# Patient Record
Sex: Female | Born: 1955 | Race: White | Hispanic: No | Marital: Married | Smoking: Never smoker
Health system: Southern US, Community
[De-identification: ages and names within clinical notes are randomized; demographics above are authoritative.]

## PROBLEM LIST (undated history)

## (undated) DIAGNOSIS — E7800 Pure hypercholesterolemia, unspecified: Secondary | ICD-10-CM

## (undated) DIAGNOSIS — Z800 Family history of malignant neoplasm of digestive organs: Secondary | ICD-10-CM

## (undated) DIAGNOSIS — K449 Diaphragmatic hernia without obstruction or gangrene: Secondary | ICD-10-CM

## (undated) DIAGNOSIS — C4491 Basal cell carcinoma of skin, unspecified: Secondary | ICD-10-CM

## (undated) DIAGNOSIS — Z9889 Other specified postprocedural states: Secondary | ICD-10-CM

## (undated) DIAGNOSIS — T7840XA Allergy, unspecified, initial encounter: Secondary | ICD-10-CM

## (undated) DIAGNOSIS — C44602 Unspecified malignant neoplasm of skin of right upper limb, including shoulder: Secondary | ICD-10-CM

## (undated) DIAGNOSIS — Z808 Family history of malignant neoplasm of other organs or systems: Secondary | ICD-10-CM

## (undated) DIAGNOSIS — G63 Polyneuropathy in diseases classified elsewhere: Secondary | ICD-10-CM

## (undated) DIAGNOSIS — F329 Major depressive disorder, single episode, unspecified: Secondary | ICD-10-CM

## (undated) DIAGNOSIS — R011 Cardiac murmur, unspecified: Secondary | ICD-10-CM

## (undated) DIAGNOSIS — Z803 Family history of malignant neoplasm of breast: Secondary | ICD-10-CM

## (undated) DIAGNOSIS — R252 Cramp and spasm: Principal | ICD-10-CM

## (undated) DIAGNOSIS — R269 Unspecified abnormalities of gait and mobility: Secondary | ICD-10-CM

## (undated) DIAGNOSIS — K219 Gastro-esophageal reflux disease without esophagitis: Secondary | ICD-10-CM

## (undated) DIAGNOSIS — F32A Depression, unspecified: Secondary | ICD-10-CM

## (undated) DIAGNOSIS — E109 Type 1 diabetes mellitus without complications: Secondary | ICD-10-CM

## (undated) DIAGNOSIS — M1990 Unspecified osteoarthritis, unspecified site: Secondary | ICD-10-CM

## (undated) DIAGNOSIS — H269 Unspecified cataract: Secondary | ICD-10-CM

## (undated) HISTORY — DX: Type 1 diabetes mellitus without complications: E10.9

## (undated) HISTORY — DX: Polyneuropathy in diseases classified elsewhere: G63

## (undated) HISTORY — DX: Family history of malignant neoplasm of breast: Z80.3

## (undated) HISTORY — DX: Pure hypercholesterolemia, unspecified: E78.00

## (undated) HISTORY — DX: Allergy, unspecified, initial encounter: T78.40XA

## (undated) HISTORY — PX: KNEE ARTHROSCOPY: SUR90

## (undated) HISTORY — DX: Family history of malignant neoplasm of digestive organs: Z80.0

## (undated) HISTORY — DX: Cramp and spasm: R25.2

## (undated) HISTORY — DX: Unspecified osteoarthritis, unspecified site: M19.90

## (undated) HISTORY — PX: CHOLECYSTECTOMY: SHX55

## (undated) HISTORY — PX: MANDIBLE SURGERY: SHX707

## (undated) HISTORY — DX: Unspecified malignant neoplasm of skin of right upper limb, including shoulder: C44.602

## (undated) HISTORY — DX: Basal cell carcinoma of skin, unspecified: C44.91

## (undated) HISTORY — DX: Gastro-esophageal reflux disease without esophagitis: K21.9

## (undated) HISTORY — PX: TONSILLECTOMY: SUR1361

## (undated) HISTORY — PX: OTHER SURGICAL HISTORY: SHX169

## (undated) HISTORY — DX: Diaphragmatic hernia without obstruction or gangrene: K44.9

## (undated) HISTORY — PX: APPENDECTOMY: SHX54

## (undated) HISTORY — DX: Depression, unspecified: F32.A

## (undated) HISTORY — PX: PARTIAL HYSTERECTOMY: SHX80

## (undated) HISTORY — DX: Family history of malignant neoplasm of other organs or systems: Z80.8

## (undated) HISTORY — DX: Unspecified abnormalities of gait and mobility: R26.9

## (undated) HISTORY — DX: Cardiac murmur, unspecified: R01.1

## (undated) HISTORY — DX: Unspecified cataract: H26.9

---
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
## 1998-07-28 ENCOUNTER — Ambulatory Visit (HOSPITAL_COMMUNITY): Admission: RE | Admit: 1998-07-28 | Discharge: 1998-07-28 | Payer: Self-pay | Admitting: Obstetrics and Gynecology

## 1999-03-16 ENCOUNTER — Encounter: Admission: RE | Admit: 1999-03-16 | Discharge: 1999-06-14 | Payer: Self-pay | Admitting: Internal Medicine

## 1999-06-18 ENCOUNTER — Other Ambulatory Visit: Admission: RE | Admit: 1999-06-18 | Discharge: 1999-06-18 | Payer: Self-pay | Admitting: Obstetrics and Gynecology

## 2000-07-21 ENCOUNTER — Other Ambulatory Visit: Admission: RE | Admit: 2000-07-21 | Discharge: 2000-07-21 | Payer: Self-pay | Admitting: Obstetrics and Gynecology

## 2001-07-02 ENCOUNTER — Encounter: Admission: RE | Admit: 2001-07-02 | Discharge: 2001-09-30 | Payer: Self-pay | Admitting: Internal Medicine

## 2001-07-27 ENCOUNTER — Other Ambulatory Visit: Admission: RE | Admit: 2001-07-27 | Discharge: 2001-07-27 | Payer: Self-pay | Admitting: Obstetrics and Gynecology

## 2001-08-18 ENCOUNTER — Other Ambulatory Visit: Admission: RE | Admit: 2001-08-18 | Discharge: 2001-08-18 | Payer: Self-pay | Admitting: Obstetrics and Gynecology

## 2002-09-22 ENCOUNTER — Other Ambulatory Visit: Admission: RE | Admit: 2002-09-22 | Discharge: 2002-09-22 | Payer: Self-pay | Admitting: Obstetrics and Gynecology

## 2002-10-08 ENCOUNTER — Encounter: Admission: RE | Admit: 2002-10-08 | Discharge: 2002-10-08 | Payer: Self-pay | Admitting: Orthopedic Surgery

## 2002-10-08 ENCOUNTER — Encounter: Payer: Self-pay | Admitting: Orthopedic Surgery

## 2003-03-11 ENCOUNTER — Encounter: Admission: RE | Admit: 2003-03-11 | Discharge: 2003-03-11 | Payer: Self-pay | Admitting: Orthopedic Surgery

## 2003-08-01 ENCOUNTER — Encounter: Payer: Self-pay | Admitting: Internal Medicine

## 2003-09-01 ENCOUNTER — Encounter (INDEPENDENT_AMBULATORY_CARE_PROVIDER_SITE_OTHER): Payer: Self-pay | Admitting: *Deleted

## 2003-09-01 ENCOUNTER — Observation Stay (HOSPITAL_COMMUNITY): Admission: RE | Admit: 2003-09-01 | Discharge: 2003-09-02 | Payer: Self-pay | Admitting: Obstetrics and Gynecology

## 2003-09-20 ENCOUNTER — Encounter: Payer: Self-pay | Admitting: Internal Medicine

## 2004-08-29 ENCOUNTER — Encounter: Admission: RE | Admit: 2004-08-29 | Discharge: 2004-08-29 | Payer: Self-pay | Admitting: Orthopedic Surgery

## 2004-11-16 ENCOUNTER — Other Ambulatory Visit: Admission: RE | Admit: 2004-11-16 | Discharge: 2004-11-16 | Payer: Self-pay | Admitting: Obstetrics and Gynecology

## 2005-02-01 ENCOUNTER — Ambulatory Visit (HOSPITAL_BASED_OUTPATIENT_CLINIC_OR_DEPARTMENT_OTHER): Admission: RE | Admit: 2005-02-01 | Discharge: 2005-02-01 | Payer: Self-pay | Admitting: Urology

## 2005-02-01 ENCOUNTER — Ambulatory Visit (HOSPITAL_COMMUNITY): Admission: RE | Admit: 2005-02-01 | Discharge: 2005-02-01 | Payer: Self-pay | Admitting: Urology

## 2005-11-07 ENCOUNTER — Encounter: Admission: RE | Admit: 2005-11-07 | Discharge: 2005-11-07 | Payer: Self-pay | Admitting: Orthopedic Surgery

## 2006-01-30 ENCOUNTER — Encounter: Admission: RE | Admit: 2006-01-30 | Discharge: 2006-01-30 | Payer: Self-pay | Admitting: Obstetrics and Gynecology

## 2006-08-11 ENCOUNTER — Encounter: Admission: RE | Admit: 2006-08-11 | Discharge: 2006-08-11 | Payer: Self-pay | Admitting: Orthopedic Surgery

## 2007-01-05 ENCOUNTER — Encounter: Admission: RE | Admit: 2007-01-05 | Discharge: 2007-01-05 | Payer: Self-pay | Admitting: Orthopedic Surgery

## 2007-05-21 ENCOUNTER — Encounter: Admission: RE | Admit: 2007-05-21 | Discharge: 2007-05-21 | Payer: Self-pay | Admitting: Orthopedic Surgery

## 2008-05-27 ENCOUNTER — Encounter: Admission: RE | Admit: 2008-05-27 | Discharge: 2008-05-27 | Payer: Self-pay | Admitting: Family Medicine

## 2008-08-03 ENCOUNTER — Encounter (INDEPENDENT_AMBULATORY_CARE_PROVIDER_SITE_OTHER): Payer: Self-pay | Admitting: *Deleted

## 2008-09-27 DIAGNOSIS — K6389 Other specified diseases of intestine: Secondary | ICD-10-CM | POA: Insufficient documentation

## 2008-09-27 DIAGNOSIS — I059 Rheumatic mitral valve disease, unspecified: Secondary | ICD-10-CM | POA: Insufficient documentation

## 2008-09-27 DIAGNOSIS — Z8659 Personal history of other mental and behavioral disorders: Secondary | ICD-10-CM | POA: Insufficient documentation

## 2008-09-27 DIAGNOSIS — I10 Essential (primary) hypertension: Secondary | ICD-10-CM | POA: Insufficient documentation

## 2008-09-27 DIAGNOSIS — E108 Type 1 diabetes mellitus with unspecified complications: Secondary | ICD-10-CM | POA: Insufficient documentation

## 2008-10-05 ENCOUNTER — Ambulatory Visit: Payer: Self-pay | Admitting: Internal Medicine

## 2008-11-04 ENCOUNTER — Encounter: Payer: Self-pay | Admitting: Internal Medicine

## 2008-11-04 ENCOUNTER — Ambulatory Visit: Payer: Self-pay | Admitting: Internal Medicine

## 2008-11-07 ENCOUNTER — Encounter: Payer: Self-pay | Admitting: Internal Medicine

## 2010-05-06 ENCOUNTER — Encounter: Payer: Self-pay | Admitting: Family Medicine

## 2010-07-22 LAB — GLUCOSE, CAPILLARY
Glucose-Capillary: 126 mg/dL — ABNORMAL HIGH (ref 70–99)
Glucose-Capillary: 91 mg/dL (ref 70–99)

## 2010-08-24 ENCOUNTER — Emergency Department (HOSPITAL_BASED_OUTPATIENT_CLINIC_OR_DEPARTMENT_OTHER)
Admission: EM | Admit: 2010-08-24 | Discharge: 2010-08-24 | Disposition: A | Discharge status: Home / Self Care | Payer: BC Managed Care – PPO | Attending: Emergency Medicine | Admitting: Emergency Medicine

## 2010-08-24 DIAGNOSIS — E1169 Type 2 diabetes mellitus with other specified complication: Secondary | ICD-10-CM | POA: Insufficient documentation

## 2010-08-27 LAB — GLUCOSE, CAPILLARY
Glucose-Capillary: 316 mg/dL — ABNORMAL HIGH (ref 70–99)
Glucose-Capillary: 335 mg/dL — ABNORMAL HIGH (ref 70–99)
Glucose-Capillary: 367 mg/dL — ABNORMAL HIGH (ref 70–99)

## 2010-08-31 NOTE — Op Note (Signed)
NAMEWILLA, BROCKS              ACCOUNT NO.:  [id number]   MEDICAL RECORD NO.:  [id number]          PATIENT TYPE:  AMB   LOCATION:  [facility]                         FACILITY:  [facility]   PHYSICIAN:  Jamison Neighbor, M.D.  DATE OF BIRTH:  [birth year]   DATE OF PROCEDURE:  02/01/2005  DATE OF DISCHARGE:                                 OPERATIVE REPORT   SERVICE:  Urology.   PREOPERATIVE DIAGNOSES:  Chronic pelvic pain, possible interstitial  cystitis.   POSTOPERATIVE DIAGNOSES:  Chronic pelvic pain, possible interstitial  cystitis.   PROCEDURE:  Cystoscopy, urethral calibration, hydrodistention of the  bladder, Marcaine and Pyridium installation, Marcaine and Kenalog injection.   SURGEON:  Jamison Neighbor, M.D.   ANESTHESIA:  General.   COMPLICATIONS:  None.   DRAINS:  None.   BRIEF HISTORY:  This 55 year old female has had problems with dyspareunia  and chronic pelvic pain. The patient has been referred from Dr. Richardean Chimera  to determine if the patient might have interstitial cystitis. On our initial  examination, she did have some pain but it was thought that it might be more  of a pelvic floor syndrome than an actual interstitial cystitis. She did  have an elevated PUF score that came back at 19. The patient was offered a  potassium test or a hydrodistention and has requested the hydrodistention be  performed. She understands the risks and benefits of the procedure and gave  full informed consent.   DESCRIPTION OF PROCEDURE:  After successful induction of general anesthesia,  the patient was placed in the dorsal lithotomy position, prepped with  Betadine and draped in the usual sterile fashion. 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 The urethra was calibrated to 43 [place]  with female urethral  sounds with no signs of stenosis or stricture. The cystoscope was inserted,  the bladder was carefully inspected and was free of any tumor or stones.  Both ureteral orifices were normal in configuration and location. The  bladder was distended at a pressure of 100 cmH2O for 5 minutes and when the  bladder was drained the bladder capacity was 1150 mL which is exactly the  average for interstitial cystitis. Little in the way of glomerulations could  be seen and this was a normal appearing bladder. It would appear that the  patient does not have a classic form of interstitial cystitis and it is felt  at this point that it is not likely that the bladder is a source for her  chronic pelvic pain. The patient had Marcaine and Pyridium left within the  bladder, Marcaine and Kenalog were injected periurethrally. The patient  tolerated the procedure well and was taken to the recovery room in good  condition. She was given intraoperative Toradol, zofran and a B&O  suppository. She will be sent home with Tylox, Pyridium plus, and a short  course of Levaquin. When  the patient returns she will be advised that this  was a  normal cystoscopy and unless she comes in with dramatic improvement in her  symptoms, we will go on the assumption this is not likely to be from a  bladder source and recommend that she would consider physical therapy since  there does appear to be a significant component of pelvic floor dysfunction  affecting her vaginal pain.           ______________________________  Jamison Neighbor, M.D.  Electronically Signed     RJE/MEDQ  D:  02/01/2005  T:  02/01/2005  Job:  [id number]   cc:   Juluis Mire, M.D.  Fax: [phone]

## 2010-08-31 NOTE — Op Note (Signed)
NAME:  Dawn Lewis, WHETSEL                        ACCOUNT NO.:  [id number]   MEDICAL RECORD NO.:  [id number]                   PATIENT TYPE:  OBV   LOCATION:  9303                                 FACILITY:  [facility]   PHYSICIAN:  Juluis Mire, M.D.                DATE OF BIRTH:  11/15/1955   DATE OF PROCEDURE:  09/01/2003  DATE OF DISCHARGE:                                 OPERATIVE REPORT   PREOPERATIVE DIAGNOSIS:  Pelvic endometriosis, uterine adenomyosis, uterine  fibroid.   POSTOPERATIVE DIAGNOSIS:  Pelvic endometriosis, uterine adenomyosis, uterine  fibroid.   OPERATIVE PROCEDURE:  Open laparoscopy, laparoscopic assisted vaginal  hysterectomy.   SURGEON:  Juluis Mire, M.D.   ASSISTANT:  Duke Salvia. Marcelle Overlie, M.D.   ANESTHESIA:  General endotracheal anesthesia.   ESTIMATED BLOOD LOSS:  200 to 300 mL.   PACKS AND DRAINS:  None.   BLOOD REPLACED:  None.   COMPLICATIONS:  None.   INDICATIONS FOR PROCEDURE:  See dictated history and physical.   PROCEDURE IN DETAIL:  The patient was taken to the OR and placed in supine  position.  After a satisfactory level of general endotracheal anesthesia was  obtained, the patient was placed in the dorsal lithotomy position  using the  Allen stirrups.  The abdomen, perineum, and vagina were prepped out with  Betadine.  The bladder was emptied with catheterization.  A Hulka tenaculum  was put in place.  The patient was then draped in a sterile field.  A  subumbilical incision was made with the knife.  The fascia was identified  and entered sharply and the incision in the fascia extended laterally.  With  digital pressure, we were able to enter the peritoneum.  There were no  periumbilical adhesions.  The open laparoscopic trocar was put in place and  secured and the abdomen was inflated with carbon dioxide.  The laparoscope  was then introduced.  There were omental adhesions to the right lower  quadrant at the site of the previous  appendectomy.  The uterus was enlarged  and irregular.  There was a large fundal fibroid.  She had some recurrent  endometriosis on the bladder flap and cul-de-sac area.  The left ovary had a  hemorrhagic corpus luteum but was otherwise unremarkable.  The right ovary  was clear.  There was no endometriosis involving either ovary.  Next, a 5 mm  trocar was put into place in the suprapubic area.  Using the Gyrus tripolar,  first the right utero-ovarian ligament was cauterized and incised.  The  right tube and mesosalpinx were cauterized and incised.  The right round  ligament was cauterized and incised.  We did extend the incision into the  broad ligament.  Next, the left utero-ovarian pedicle was cauterized and  incised.  The left tube and mesosalpinx were cauterized and incised.  The  left round ligament was cauterized and incised.  We had good freeing of the  adnexa at that point.  The abdomen was deflated of its carbon dioxide, the  laparoscope was removed.   The Hulka tenaculum was removed.  The patient's legs were repositioned.  A  weighted speculum was placed in the vaginal vault.  The cervix was grasped  with a Christella Hartigan tenaculum.  The cul-de-sac was entered sharply.  Both  uterosacral ligaments were clamped, cut, and suture ligated with 0 Vicryl.  The reflection of the vaginal mucosa anteriorly was incised and the bladder  was dissected superiorly.  The paracervical tissue was clamped, cut, and  suture ligated with 0 Vicryl.  Next, using the clamp, cut, and tie  technique, the parametrium was serially separated from the sides of the  uterus using suture ligatures of 0 Vicryl.  The vesicouterine space was  entered, a retractor was put in place.  At this point in time, the uterus  was then flipped, the remaining pedicles were clamped and cut, and the  uterus was passed off the operative field.  All pedicles were secured with  free ties of 0 Vicryl.  A uterosacral plication stitch of 0  Vicryl was put  in place.  Suture ligatures of 0 Vicryl were used to reapproximate the  vaginal mucosa in the midline.  A sponge on a sponge stick was placed in the  vaginal vault.  A Foley catheter was placed to straight drain with retrieval  of adequate amount of clear urine.   The patient's legs were repositioned.  The abdomen was reinflated with  carbon dioxide.  Laparoscopic evaluation basically revealed good hemostasis  at the cuff and both ovarian pedicles.  We irrigated the pelvis, there was  no active bleeding.  The abdomen was desufflated of carbon dioxide, all  trocars were removed.  The subumbilical fascia was closed with two figure-of-  eight sutures of 0 Vicryl, the skin with interrupted subcuticular 4-0  Vicryl.  The suprapubic incision was closed with Dermabond.  The sponge on a  sponge stick was removed from the vaginal vault.  The patient was taken out  of the dorsal lithotomy position, once alert and extubated, transferred to  the recovery room in good condition.  Sponge, instrument, and needle counts  was reported correct by the circulating nurse x 2.  Urine output remained  clear and adequate.                                               Juluis Mire, M.D.    JSM/MEDQ  D:  09/01/2003  T:  09/01/2003  Job:  [id number]

## 2010-08-31 NOTE — Discharge Summary (Signed)
NAME:  Dawn Lewis, Dawn Lewis                        ACCOUNT NO.:  [id number]   MEDICAL RECORD NO.:  [id number]                   PATIENT TYPE:  OBV   LOCATION:  9303                                 FACILITY:  [facility]   PHYSICIAN:  Juluis Mire, M.D.                DATE OF BIRTH:  07/19/1955   DATE OF ADMISSION:  09/01/2003  DATE OF DISCHARGE:                                 DISCHARGE SUMMARY   ADMITTING DIAGNOSIS:  Uterine adenomyosis, pelvic endometriosis.   DISCHARGE DIAGNOSIS:  Uterine adenomyosis, pelvic endometriosis.   OPERATIVE PROCEDURE:  Laparoscopic-assisted vaginal hysterectomy.   For complete history and physical please see dictated note.   COURSE IN THE HOSPITAL:  The patient underwent the above-noted surgery.  Recurrent endometriosis was noted.  Pathology is pending.   Postoperatively did well.  At the time of her first postoperative morning  she was afebrile with stable vital signs.  She was tolerating her diet.  Her  Foley had been discontinued and she was voiding without difficulty.  No  active vaginal bleeding was noted.  Abdomen was soft, nontender.  Bowel  sounds were active.  All incisions were clear.  Her CBC was pending.   COMPLICATIONS:  None encountered during stay in the hospital.  The patient  was discharged home in stable condition.   DISPOSITION:  Routine postoperative instructions and orders were given.  She  is to avoid heavy lifting, vaginal entrance, or driving a car.  She is to  watch for signs of infection, nausea and vomiting, increasing abdominal  pain, or active vaginal bleeding.  She will be discharged home on Tylox as  needed for pain, reevaluation in the office in 1 week.                                               Juluis Mire, M.D.    JSM/MEDQ  D:  09/02/2003  T:  09/02/2003  Job:  [id number]

## 2010-08-31 NOTE — H&P (Signed)
NAME:  Dawn Lewis, Dawn Lewis                        ACCOUNT NO.:  [id number]   MEDICAL RECORD NO.:  [id number]                   PATIENT TYPE:  OBV   LOCATION:  9399                                 FACILITY:  [facility]   PHYSICIAN:  Juluis Mire, M.D.                DATE OF BIRTH:  Feb 06, 1956   DATE OF ADMISSION:  09/01/2003  DATE OF DISCHARGE:                                HISTORY & PHYSICAL   HISTORY OF PRESENT ILLNESS:  The patient is a 55 year old nulligravida  married white female who presents for laparoscopic-assisted vaginal  hysterectomy.   The patient has had a history of continued menorrhagia and dysmenorrhea.  We  have attempted to control her with Prometrium.  She has been using 200 mg  day #1-12 of each month.  Presently her cycles remain extremely heavy.  She  has 3-5 days of flow, 2-3 days being heavy, changing pads and tampons every  2-3 hours with clots.  She is also having increasing pelvic pain and  discomfort.  This has become disruptive at times.  She had undergone a  previous laparoscopy and hysteroscopy in April 2000.  We resected some  benign endometrial polyps and she was noted to have some significant pelvic  endometriosis as well as probable uterine adenomyosis.  In view of the  worsening symptomatology, we have discussed various options.  The  possibility of birth control pills has been discussed, along with more  aggressive therapy.  She now presents for laparoscopic-assisted vaginal  hysterectomy.  She has chosen to leave the ovaries in place if they appear  uninvolved with the endometriosis.  We have discussed the potential risks of  leaving the ovaries in place including the risk of ovarian cancer, the  possibility of continued pelvic pain and discomfort associated with  continued hormonal stimulation and recurrent endometriosis.  The patient  professed an understanding of this and, again, wishes ovaries left in place  if amenable.   ALLERGIES:  She is  allergic to ASPIRIN and other ANTIINFLAMMATORIES.   MEDICATIONS:  Prometrium.  She is on lisinopril and an insulin pump.   PAST MEDICAL HISTORY:  Significant for a history of insulin-dependent  diabetes that is managed by Dr. Wylene Simmer.  She seems to be doing relatively  well with that.  She also has a history of mitral valve prolapse that she  takes SBE prophylaxis for.   PAST SURGICAL HISTORY:  She has had an appendectomy in 1963.  She had a  tonsillectomy in 1978 and she had the above-noted hysteroscopy and  laparoscopy.  The only other gynecological issue is she has had a history of  cervical dysplasia treated with cryotherapy in the past.  So far, follow-up  has been negative.   SOCIAL HISTORY:  There is no tobacco or alcohol use.   FAMILY HISTORY:  Noncontributory.   REVIEW OF SYSTEMS:  Noncontributory.   PHYSICAL EXAMINATION:  VITAL SIGNS:  The patient is afebrile with stable  vital signs.  HEENT:  The patient is normocephalic.  Pupils equal, round, and reactive to  light and accommodation.  The extraocular movements were intact.  Sclerae  and conjunctivae were clear, oropharynx clear.  NECK:  Without thyromegaly.  BREASTS:  No discrete masses.  LUNGS:  Clear.  CARDIOVASCULAR:  Regular rhythm and rate without murmurs or gallops.  ABDOMEN:  Benign.  No masses, organomegaly, or tenderness.  PELVIC:  Normal external genitalia, vagina mucosa is clear.  Cervix is  unremarkable.  Uterus is upper limits of normal size, mildly tender.  Adnexa  unremarkable.  Rectovaginal exam is clear.  EXTREMITIES:  Trace edema.  NEUROLOGIC:  Grossly within normal limits.   IMPRESSION:  1. Symptomatic pelvic endometriosis and/or uterine adenomyosis unresponsive     to conservative management.  2. Insulin-dependent diabetes.  3. Mitral valve prolapse.   PLAN:  The patient will need SBE prophylaxis.  Dr. Wylene Simmer will be managing  her insulin issues pre- and postoperatively.  She is scheduled  to undergo  laparoscopic-assisted vaginal hysterectomy.  Again, ovaries will be left in  place if appear normal.  The risks of surgery have been discussed including  the possibility of continued pain scenarios; the risk of infection; the risk  of hemorrhage that could require transfusion with the risk of AIDS or  hepatitis; the risk of injury to adjacent organs - this could include  bladder, bowel, or ureters that require further exploratory surgery; the  risk of deep venous thrombosis and pulmonary embolus.  The patient professed  an understanding of the indications and risks.                                               Juluis Mire, M.D.    JSM/MEDQ  D:  09/01/2003  T:  09/01/2003  Job:  [id number]

## 2011-01-20 ENCOUNTER — Emergency Department (HOSPITAL_COMMUNITY): Payer: BC Managed Care – PPO

## 2011-01-20 ENCOUNTER — Emergency Department (HOSPITAL_COMMUNITY)
Admission: EM | Admit: 2011-01-20 | Discharge: 2011-01-21 | Disposition: A | Discharge status: Home / Self Care | Payer: BC Managed Care – PPO | Attending: Emergency Medicine | Admitting: Emergency Medicine

## 2011-01-20 DIAGNOSIS — M542 Cervicalgia: Secondary | ICD-10-CM | POA: Insufficient documentation

## 2011-01-20 DIAGNOSIS — E119 Type 2 diabetes mellitus without complications: Secondary | ICD-10-CM | POA: Insufficient documentation

## 2011-01-20 DIAGNOSIS — R110 Nausea: Secondary | ICD-10-CM | POA: Insufficient documentation

## 2011-01-20 DIAGNOSIS — I672 Cerebral atherosclerosis: Secondary | ICD-10-CM | POA: Insufficient documentation

## 2011-01-20 DIAGNOSIS — Z794 Long term (current) use of insulin: Secondary | ICD-10-CM | POA: Insufficient documentation

## 2011-01-20 DIAGNOSIS — R51 Headache: Secondary | ICD-10-CM | POA: Insufficient documentation

## 2011-01-20 LAB — CBC
HCT: 40.7 % (ref 36.0–46.0)
Hemoglobin: 14.8 g/dL (ref 12.0–15.0)
MCH: 30 pg (ref 26.0–34.0)
MCHC: 36.4 g/dL — ABNORMAL HIGH (ref 30.0–36.0)
MCV: 82.6 fL (ref 78.0–100.0)
Platelets: 171 10*3/uL (ref 150–400)
RBC: 4.93 MIL/uL (ref 3.87–5.11)
RDW: 11.9 % (ref 11.5–15.5)
WBC: 4.9 10*3/uL (ref 4.0–10.5)

## 2011-01-20 LAB — DIFFERENTIAL
Basophils Absolute: 0 10*3/uL (ref 0.0–0.1)
Basophils Relative: 1 % (ref 0–1)
Eosinophils Absolute: 0.1 10*3/uL (ref 0.0–0.7)
Eosinophils Relative: 2 % (ref 0–5)
Lymphocytes Relative: 30 % (ref 12–46)
Lymphs Abs: 1.5 10*3/uL (ref 0.7–4.0)
Monocytes Absolute: 0.4 10*3/uL (ref 0.1–1.0)
Monocytes Relative: 7 % (ref 3–12)
Neutro Abs: 3 10*3/uL (ref 1.7–7.7)
Neutrophils Relative %: 60 % (ref 43–77)

## 2011-01-20 LAB — GLUCOSE, CAPILLARY: Glucose-Capillary: 143 mg/dL — ABNORMAL HIGH (ref 70–99)

## 2011-01-21 ENCOUNTER — Emergency Department (HOSPITAL_COMMUNITY): Payer: BC Managed Care – PPO

## 2011-01-21 LAB — COMPREHENSIVE METABOLIC PANEL
ALT: 52 U/L — ABNORMAL HIGH (ref 0–35)
AST: 81 U/L — ABNORMAL HIGH (ref 0–37)
Albumin: 3.8 g/dL (ref 3.5–5.2)
Alkaline Phosphatase: 49 U/L (ref 39–117)
BUN: 13 mg/dL (ref 6–23)
CO2: 19 mEq/L (ref 19–32)
Calcium: 9.1 mg/dL (ref 8.4–10.5)
Chloride: 100 mEq/L (ref 96–112)
Creatinine, Ser: 0.66 mg/dL (ref 0.50–1.10)
GFR calc Af Amer: >90 mL/min (ref >90)
GFR calc non Af Amer: >90 mL/min (ref >90)
Glucose, Bld: 229 mg/dL — ABNORMAL HIGH (ref 70–99)
Potassium: 3.9 mEq/L (ref 3.5–5.1)
Sodium: 134 mEq/L — ABNORMAL LOW (ref 135–145)
Total Bilirubin: 0.6 mg/dL (ref 0.3–1.2)
Total Protein: 7 g/dL (ref 6.0–8.3)

## 2012-07-15 ENCOUNTER — Other Ambulatory Visit: Payer: Self-pay | Admitting: Obstetrics and Gynecology

## 2012-07-15 DIAGNOSIS — R928 Other abnormal and inconclusive findings on diagnostic imaging of breast: Secondary | ICD-10-CM

## 2012-07-24 ENCOUNTER — Ambulatory Visit
Admission: RE | Admit: 2012-07-24 | Discharge: 2012-07-24 | Disposition: A | Discharge status: Home / Self Care | Payer: BC Managed Care – PPO | Source: Ambulatory Visit | Attending: Obstetrics and Gynecology | Admitting: Obstetrics and Gynecology

## 2012-07-24 DIAGNOSIS — R928 Other abnormal and inconclusive findings on diagnostic imaging of breast: Secondary | ICD-10-CM

## 2012-09-02 ENCOUNTER — Other Ambulatory Visit: Payer: Self-pay

## 2013-08-04 ENCOUNTER — Encounter: Payer: Self-pay | Admitting: Internal Medicine

## 2013-09-27 ENCOUNTER — Encounter: Payer: Self-pay | Admitting: Internal Medicine

## 2013-10-29 ENCOUNTER — Ambulatory Visit (AMBULATORY_SURGERY_CENTER): Payer: Self-pay

## 2013-10-29 ENCOUNTER — Telehealth: Payer: Self-pay

## 2013-10-29 VITALS — Ht 70.0 in | Wt 170.0 lb

## 2013-10-29 DIAGNOSIS — Z860100 Personal history of colon polyps, unspecified: Secondary | ICD-10-CM

## 2013-10-29 NOTE — Telephone Encounter (Signed)
Letter sent to Dr Osborne Casco.

## 2013-10-29 NOTE — Telephone Encounter (Signed)
PHONE NOTE FOR DB'S CMA.  PLEASE SEND LETTER REQUEST TO MD THAT MANAGES INSULIN THERAPY.  THE CLINIC NURSE WILL FOLLOW UP WITH PT WHEN INSTRUCTIONS ARE RECEIVED.

## 2013-10-29 NOTE — Progress Notes (Signed)
No allergies to soy (intolerance to egg whites causes hives) **See last procedure report; pt reported she did fine last time.  Conscious sedation Has been told she wakes up easily during general anesthesia and has needed multiple doses of novocaine during dental procedures. No home oxygen No diet/weight loss meds  Has email  Emmi instructions given for colonoscopy

## 2013-11-08 ENCOUNTER — Other Ambulatory Visit: Payer: Self-pay | Admitting: *Deleted

## 2013-11-08 NOTE — Telephone Encounter (Signed)
Dr Osborne Casco has responded to our request on instructions for insulin pump for procedure. He states "patient aware of how to manage her insulin pump during procedure scheduled. Okay to proceed." (please see scanned letter under "media") I have advised patient of Dr Loren Racer response and she states that she is indeed aware of how to manage her insulin for procedure.

## 2013-11-10 ENCOUNTER — Encounter: Payer: Self-pay | Admitting: Neurology

## 2013-11-11 ENCOUNTER — Ambulatory Visit (INDEPENDENT_AMBULATORY_CARE_PROVIDER_SITE_OTHER): Payer: BC Managed Care – PPO | Admitting: Neurology

## 2013-11-11 ENCOUNTER — Encounter (INDEPENDENT_AMBULATORY_CARE_PROVIDER_SITE_OTHER): Payer: Self-pay

## 2013-11-11 ENCOUNTER — Telehealth: Payer: Self-pay | Admitting: Neurology

## 2013-11-11 ENCOUNTER — Encounter: Payer: Self-pay | Admitting: Neurology

## 2013-11-11 VITALS — BP 103/60 | HR 75 | Ht 70.0 in | Wt 177.0 lb

## 2013-11-11 DIAGNOSIS — R269 Unspecified abnormalities of gait and mobility: Secondary | ICD-10-CM

## 2013-11-11 DIAGNOSIS — R252 Cramp and spasm: Secondary | ICD-10-CM

## 2013-11-11 DIAGNOSIS — E1142 Type 2 diabetes mellitus with diabetic polyneuropathy: Secondary | ICD-10-CM

## 2013-11-11 DIAGNOSIS — Z0289 Encounter for other administrative examinations: Secondary | ICD-10-CM

## 2013-11-11 HISTORY — DX: Cramp and spasm: R25.2

## 2013-11-11 HISTORY — DX: Unspecified abnormalities of gait and mobility: R26.9

## 2013-11-11 NOTE — Progress Notes (Signed)
Reason for visit: Muscle cramps  Dawn Lewis is a 58 y.o. female  History of present illness:  Dawn Lewis is a 58 year old right-handed white female with a history of type 1 diabetes since age 58. The patient indicates that she has had some significant issues with muscle cramps involving the legs primarily that began in September 2013. The patient works at [employer], and she was doing a lot of walking that particular day. The patient developed severe cramps in the feet and legs below the knees bilaterally. The patient has had some issues with cramps since that time, but the cramps now involve the left greater than right thigh, and may be induced by physical activity. She denies any significant low back pain. Occasionally, may be some cramps in the neck and shoulders associated with headache. The patient has to be careful about her activities, and she indicates that she could have cramps daily if she is not careful. The patient is using magnesium supplementation. She believes that there is some weakness of both lower extremities, and she is having some problems with climbing stairs or getting up out of chairs. She also believes that her balance has been affected. She denies problems controlling the bowels or the bladder. Occasionally, she will experience hypoglycemia, and her balance may be significantly worse during these periods of time. The patient does have some occasional episodes of dizziness. She reports some issues for many years of problems with handwriting, indicating that with prolonged writing, her right hand will seem to become uncoordinated. She denies any significant peripheral neuropathy symptoms of numbness or burning in the feet. She indicates that she does get nocturnal leg cramps as well. She is sent to this office for an evaluation. There is no family history of similar problems. She has not noted any fasciculations of the muscles.    Past Medical History  Diagnosis Date  .  Hiatal hernia   . Hypercholesterolemia   . Muscle cramps 11/11/2013  . Abnormality of gait 11/11/2013  . Type 1 diabetes     Past Surgical History  Procedure Laterality Date  . Appendectomy    . Tonsillectomy    . Partial hysterectomy    . Mandible surgery      extension  . Knee arthroscopy      left meniscus repair  . Cholecystectomy    . Tail bone repair      Coccyx fracture, subsequent resection    Family History  Problem Relation Age of Onset  . Colon cancer Neg Hx   . Stroke Mother     Social history:  reports that she has never smoked. She has never used smokeless tobacco. She reports that she does not drink alcohol or use illicit drugs.  Medications:  Current Outpatient Prescriptions on File Prior to Visit  Medication Sig Dispense Refill  . Black Cohosh 40 MG CAPS Take by mouth.      . cetirizine (ZYRTEC) 10 MG tablet Take 10 mg by mouth daily.      Marland Kitchen estradiol (ESTRACE) 0.1 MG/GM vaginal cream Place 1 Applicatorful vaginally at bedtime.      Marland Kitchen lisinopril (PRINIVIL,ZESTRIL) 20 MG tablet Take 20 mg by mouth daily.      . Multiple Vitamin (MULTIVITAMIN) tablet Take 1 tablet by mouth daily.      . Omega-3 Fatty Acids (OMEGA 3 PO) Take 2,000 mg by mouth daily.       Marland Kitchen PRESCRIPTION MEDICATION Novolog insulin via PUMP for DM (28U  daily on average)      . sertraline (ZOLOFT) 50 MG tablet Take 50 mg by mouth daily.      Marland Kitchen terconazole (TERAZOL 7) 0.4 % vaginal cream Place 1 applicator vaginally as needed. Daily at bedtime as needed       No current facility-administered medications on file prior to visit.      Allergies  Allergen Reactions  . Aspirin   . Nsaids Hives    Can lead to anaphylactic    ROS:  Out of a complete 14 system review of symptoms, the patient complains only of the following symptoms, and all other reviewed systems are negative.  Weight gain, fatigue Unusual sensation in the chest Dizziness, difficulty swallowing Birthmarks,  moles Constipation Joint pain, joint swelling, muscle cramps Allergies Headache, weakness, difficulty swallowing, dizziness Too much sleep, decreased energy  Blood pressure 103/60, pulse 75, height 5\' 10"  (1.778 m), weight 177 lb (80.287 kg).  Physical Exam  General: The patient is alert and cooperative at the time of the examination.  Eyes: Pupils are equal, round, and reactive to light. Discs are flat bilaterally.  Neck: The neck is supple, no carotid bruits are noted.  Respiratory: The respiratory examination is clear.  Cardiovascular: The cardiovascular examination reveals a regular rate and rhythm, no obvious murmurs or rubs are noted.  Skin: Extremities are without significant edema.  Neurologic Exam  Mental status: The patient is alert and oriented x 3 at the time of the examination. The patient has apparent normal recent and remote memory, with an apparently normal attention span and concentration ability.  Cranial nerves: Facial symmetry is present. There is good sensation of the face to pinprick and soft touch bilaterally. The strength of the facial muscles and the muscles to head turning and shoulder shrug are normal bilaterally. Speech is well enunciated, no aphasia or dysarthria is noted. Extraocular movements are full. Visual fields are full. The tongue is midline, and the patient has symmetric elevation of the soft palate. No obvious hearing deficits are noted. The patient has a mild side-to-side head and neck tremor.  Motor: The motor testing reveals 5 over 5 strength of all 4 extremities. Giveaway weakness is seen proximally in both legs. Good symmetric motor tone is noted throughout. The patient is able to walk on heels and the toes bilaterally.  Sensory: Sensory testing is intact to pinprick, soft touch, vibration sensation, and position sense on all 4 extremities, with exception that there is a stocking pattern pinprick sensory deficit up to the knees bilaterally.  No evidence of extinction is noted.  Coordination: Cerebellar testing reveals good finger-nose-finger and heel-to-shin bilaterally.  Gait and station: Gait is normal. Tandem gait is unsteady. Romberg is negative. No drift is seen.  Reflexes: Deep tendon reflexes are symmetric and normal bilaterally, with exception that the right knee jerk reflex is absent. Toes are downgoing bilaterally.   Assessment/Plan:  1. Type 1 diabetes  2. Muscle cramps  3. Reported muscle weakness  Clinical examination today does not show true muscle weakness, the patient has giveaway weakness proximally in the legs. She does have a dropped knee jerk reflex on the right. The patient will need to be evaluated for a primary muscle disorder or other entity that may result in muscle cramps. She will be evaluated for a stiff person syndrome and for a possible cramp-fasciculation syndrome. She has not noted previously any signs of rhabdomyolysis with severe weakness after cramping or change in color of the urine following episodes  of cramping. Blood work will be done today, and she will followup for nerve conduction studies done on both legs and one arm, and EMG evaluation on 1 leg and one arm. She will followup for the EMG evaluation.  Dawn Alexanders MD 11/11/2013 11:29 AM  [facility] [address] [place],  [address]  Phone [phone] Fax [phone]

## 2013-11-11 NOTE — Procedures (Signed)
HISTORY:  Dawn Lewis is a 58 year old patient with type 1 diabetes who reports a two-year history of intermittent cramps primarily in the lower extremities above and below the knees. She reports some weakness of all 4 extremities. She is being evaluated for a neuropathy or a primary muscle disorder.  NERVE CONDUCTION STUDIES:  Nerve conduction studies were performed on the right upper extremity. The distal motor latencies and motor amplitudes for the median and ulnar nerves were within normal limits. The F wave latencies and nerve conduction velocities for these nerves were also normal. The sensory latencies for the median and ulnar nerves were normal.  Nerve conduction studies were performed on both lower extremities. The distal motor latencies for the peroneal and posterior tibial nerves were normal bilaterally, with low motor amplitudes seen for the peroneal nerves bilaterally, normal for the posterior tibial nerves bilaterally. The nerve conduction velocities seen for the peroneal and posterior tibial nerves were normal bilaterally. The H reflex latencies were unobtainable bilaterally, and the peroneal sensory latencies were unobtainable bilaterally.  EMG STUDIES:  EMG study was performed on the right upper extremity:  The first dorsal interosseous muscle reveals 2 to 4 K units with full recruitment. No fibrillations or positive waves were noted. The abductor pollicis brevis muscle reveals 2 to 4 K units with full recruitment. No fibrillations or positive waves were noted. The extensor indicis proprius muscle reveals 1 to 3 K units with full recruitment. No fibrillations or positive waves were noted. The pronator teres muscle reveals 2 to 3 K units with full recruitment. No fibrillations or positive waves were noted. The biceps muscle reveals 1 to 2 K units with full recruitment. No fibrillations or positive waves were noted. The triceps muscle reveals 2 to 4 K units with full  recruitment. No fibrillations or positive waves were noted. The anterior deltoid muscle reveals 2 to 3 K units with full recruitment. No fibrillations or positive waves were noted. The cervical paraspinal muscles were tested at 2 levels. No abnormalities of insertional activity were seen at either level tested. There was good relaxation.  EMG study was performed on the right lower extremity:  The tibialis anterior muscle reveals 2 to 5K motor units with decreased recruitment. No fibrillations or positive waves were seen. The peroneus tertius muscle reveals 2 to 5K motor units with decreased recruitment. No fibrillations or positive waves were seen. The medial gastrocnemius muscle reveals 1 to 3K motor units with full recruitment. No fibrillations or positive waves were seen. The vastus lateralis muscle reveals 2 to 4K motor units with full recruitment. No fibrillations or positive waves were seen. The iliopsoas muscle reveals 2 to 4K motor units with full recruitment. No fibrillations or positive waves were seen. The biceps femoris muscle (long head) reveals 2 to 4K motor units with full recruitment. No fibrillations or positive waves were seen. The lumbosacral paraspinal muscles were tested at 3 levels, and revealed no abnormalities of insertional activity at all 3 levels tested. There was good relaxation.   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 There is predominant dysfunction of the peroneal nerves bilaterally. EMG evaluation of the right upper extremity is unremarkable, without evidence of an overlying cervical radiculopathy. 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 There  is no evidence of an overlying right lumbosacral  radiculopathy. There is no evidence on EMG evaluation of the right arm and leg of any primary muscle disease.  Jill Alexanders MD 11/11/2013 12:50 PM  [facility] [address] [place], [place] [address]  Phone [phone] Fax [phone]

## 2013-11-11 NOTE — Patient Instructions (Signed)
Leg Cramps Leg cramps that occur during exercise can be caused by poor circulation or dehydration. However, muscle cramps that occur at rest or during the night are usually not due to any serious medical problem. Heat cramps may cause muscle spasms during hot weather.  CAUSES There is no clear cause for muscle cramps. However, dehydration may be a factor for those who do not drink enough fluids and those who exercise in the heat. Imbalances in the level of sodium, potassium, calcium or magnesium in the muscle tissue may also be a factor. Some medications, such as water pills (diuretics), may cause loss of chemicals that the body needs (like sodium and potassium) and cause muscle cramps. TREATMENT   Make sure your diet has enough fluids and essential minerals for the muscle to work normally.  Avoid strenuous exercise for several days if you have been having frequent leg cramps.  Stretch and massage the cramped muscle for several minutes.  Some medicines may be helpful in some patients with night cramps. Only take over-the-counter or prescription medicines as directed by your caregiver. SEEK IMMEDIATE MEDICAL CARE IF:   Your leg cramps become worse.  Your foot becomes cold, numb, or blue. Document Released: 05/09/2004 Document Revised: 06/24/2011 Document Reviewed: 04/26/2008 Exit[employer] Patient Information 2015 [employer]. This information is not intended to replace advice given to you by your health care provider. Make sure you discuss any questions you have with your health care provider.  

## 2013-11-11 NOTE — Telephone Encounter (Signed)
I called the patient. The EMG and nerve conduction study does show evidence of a mild to moderate diabetic peripheral neuropathy. No abnormalities with a muscle is seen. We will check blood work, all followup with her in 3 or 4 months.

## 2013-11-15 LAB — ANGIOTENSIN CONVERTING ENZYME: Angio Convert Enzyme: 24 U/L (ref 14–82)

## 2013-11-15 LAB — TSH: TSH: 2.87 u[IU]/mL (ref 0.450–4.500)

## 2013-11-15 LAB — CK: Total CK: 68 U/L (ref 24–173)

## 2013-11-15 LAB — ANA W/REFLEX: ANA: NEGATIVE

## 2013-11-15 LAB — GAD-65 AUTOANTIBODY

## 2013-11-15 LAB — VITAMIN B12: Vitamin B-12: 1207 pg/mL — ABNORMAL HIGH (ref 211–946)

## 2013-11-15 LAB — LYME, TOTAL AB TEST/REFLEX: Lyme IgG/IgM Ab: <0.91 {ISR} (ref 0.00–0.90)

## 2013-11-15 LAB — SEDIMENTATION RATE: SED RATE: 2 mm/h (ref 0–40)

## 2013-11-15 LAB — RHEUMATOID FACTOR: Rhuematoid fact SerPl-aCnc: 9.5 IU/mL (ref 0.0–13.9)

## 2013-11-25 ENCOUNTER — Encounter: Payer: Self-pay | Admitting: Internal Medicine

## 2013-12-01 ENCOUNTER — Encounter: Payer: BC Managed Care – PPO | Admitting: Neurology

## 2013-12-03 ENCOUNTER — Encounter: Payer: Self-pay | Admitting: Internal Medicine

## 2013-12-03 ENCOUNTER — Ambulatory Visit (AMBULATORY_SURGERY_CENTER): Payer: BC Managed Care – PPO | Admitting: Internal Medicine

## 2013-12-03 VITALS — BP 111/66 | HR 62 | Temp 97.2°F | Resp 13

## 2013-12-03 DIAGNOSIS — Z1211 Encounter for screening for malignant neoplasm of colon: Secondary | ICD-10-CM

## 2013-12-03 DIAGNOSIS — Z86010 Personal history of colonic polyps: Secondary | ICD-10-CM

## 2013-12-03 DIAGNOSIS — Z800 Family history of malignant neoplasm of digestive organs: Secondary | ICD-10-CM

## 2013-12-03 MED ORDER — SODIUM CHLORIDE 0.9 % IV SOLN: 500.0000 mL | INTRAVENOUS | Status: DC

## 2013-12-03 NOTE — Patient Instructions (Signed)
YOU HAD AN ENDOSCOPIC PROCEDURE TODAY AT THE [facility]: Refer to the procedure report that was given to you for any specific questions about what was found during the examination.  If the procedure report does not answer your questions, please call your gastroenterologist to clarify.  If you requested that your care partner not be given the details of your procedure findings, then the procedure report has been included in a sealed envelope for you to review at your convenience later.  YOU SHOULD EXPECT: Some feelings of bloating in the abdomen. Passage of more gas than usual.  Walking can help get rid of the air that was put into your GI tract during the procedure and reduce the bloating. If you had a lower endoscopy (such as a colonoscopy or flexible sigmoidoscopy) you may notice spotting of blood in your stool or on the toilet paper. If you underwent a bowel prep for your procedure, then you may not have a normal bowel movement for a few days.  DIET: Your first meal following the procedure should be a light meal and then it is ok to progress to your normal diet.  A half-sandwich or bowl of soup is an example of a good first meal.  Heavy or fried foods are harder to digest and may make you feel nauseous or bloated.  Likewise meals heavy in dairy and vegetables can cause extra gas to form and this can also increase the bloating.  Drink plenty of fluids but you should avoid alcoholic beverages for 24 hours.  ACTIVITY: Your care partner should take you home directly after the procedure.  You should plan to take it easy, moving slowly for the rest of the day.  You can resume normal activity the day after the procedure however you should NOT DRIVE or use heavy machinery for 24 hours (because of the sedation medicines used during the test).    SYMPTOMS TO REPORT IMMEDIATELY: A gastroenterologist can be reached at any hour.  During normal business hours, 8:30 AM to 5:00 PM Monday through Friday,  call [phone].  After hours and on weekends, please call the GI answering service at [phone] who will take a message and have the physician on call contact you.   Following lower endoscopy (colonoscopy or flexible sigmoidoscopy):  Excessive amounts of blood in the stool  Significant tenderness or worsening of abdominal pains  Swelling of the abdomen that is new, acute  Fever of 100F or higher    FOLLOW UP: If any biopsies were taken you will be contacted by phone or by letter within the next 1-3 weeks.  Call your gastroenterologist if you have not heard about the biopsies in 3 weeks.  Our staff will call the home number listed on your records the next business day following your procedure to check on you and address any questions or concerns that you may have at that time regarding the information given to you following your procedure. This is a courtesy call and so if there is no answer at the home number and we have not heard from you through the emergency physician on call, we will assume that you have returned to your regular daily activities without incident.  SIGNATURES/CONFIDENTIALITY: You and/or your care partner have signed paperwork which will be entered into your electronic medical record.  These signatures attest to the fact that that the information above on your After Visit Summary has been reviewed and is understood.  Full responsibility of the confidentiality   of this discharge information lies with you and/or your care-partner.     

## 2013-12-03 NOTE — Progress Notes (Signed)
Procedure ends, to recovery, report given and VSS. 

## 2013-12-03 NOTE — Op Note (Signed)
[place]  [address] [place], [address]   COLONOSCOPY PROCEDURE REPORT  PATIENT: Dawn Lewis, Dawn Lewis  MR#: [id number] BIRTHDATE: 10/24/1955 , 68  yrs. old GENDER: Female ENDOSCOPIST: Lafayette Dragon, MD REFERRED BM:WUXLKGM Tisovec, M.D. PROCEDURE DATE:  12/03/2013 PROCEDURE:   Colonoscopy, screening First Screening Colonoscopy - Avg.  risk and is 50 yrs.  old or older - No.  Prior Negative Screening - Now for repeat screening. Above average risk  History of Adenoma - Now for follow-up colonoscopy & has been > or = to 3 yrs.  N/A  Polyps Removed Today? No.  Recommend repeat exam, <10 yrs? Yes.  High risk (family or personal hx). ASA CLASS:   Class II INDICATIONS:prior colonoscopy in 2000 and, 2005 and in July 2010. Positive family history of colon cancer in father and maternal grandfather and paternal grandfather.  Family history of colon polyps. MEDICATIONS: MAC sedation, administered by CRNA and propofol (Diprivan) 200mg  IV  DESCRIPTION OF PROCEDURE:   After the risks benefits and alternatives of the procedure were thoroughly explained, informed consent was obtained.  A digital rectal exam revealed no abnormalities of the rectum.   The LB [id number]  endoscope was introduced through the anus and advanced to the cecum, which was identified by both the appendix and ileocecal valve. No adverse events experienced.   The quality of the prep was good, using MoviPrep  The instrument was then slowly withdrawn as the colon was fully examined.      COLON FINDINGS: A normal appearing cecum, ileocecal valve, and appendiceal orifice were identified.  The ascending, hepatic flexure, transverse, splenic flexure, descending, sigmoid colon and rectum appeared unremarkable.  No polyps or cancers were seen. Mild melanosis was found throughout the entire examined colon. Retroflexed views revealed no abnormalities. The time to cecum=5 minutes 45 seconds.   Withdrawal time=6 minutes 15 seconds.  The scope was withdrawn and the procedure completed. COMPLICATIONS: There were no complications.  ENDOSCOPIC IMPRESSION: 1.   Normal colon 2.   Mild melanosis was found throughout the entire examined colon  RECOMMENDATIONS: high-fiber diet Recall colonoscopy in 5 years   eSigned:  Lafayette Dragon, MD 12/03/2013 11:32 AM   cc:   PATIENT NAME:  Mechell, Girgis MR#: [id number]

## 2013-12-06 ENCOUNTER — Encounter: Payer: BC Managed Care – PPO | Admitting: Radiology

## 2013-12-06 ENCOUNTER — Telehealth: Payer: Self-pay | Admitting: *Deleted

## 2013-12-06 ENCOUNTER — Encounter: Payer: BC Managed Care – PPO | Admitting: Neurology

## 2013-12-06 NOTE — Telephone Encounter (Signed)
  Follow up Call-  Call back number 12/03/2013  Post procedure Call Back phone  # [phone]  Permission to leave phone message Yes     Patient questions:  Do you have a fever, pain , or abdominal swelling? No. Pain Score  0 *  Have you tolerated food without any problems? Yes.    Have you been able to return to your normal activities? Yes.    Do you have any questions about your discharge instructions: Diet   No. Medications  No. Follow up visit  No.  Do you have questions or concerns about your Care? No.  Actions: * If pain score is 4 or above: No action needed, pain <4.

## 2013-12-29 ENCOUNTER — Telehealth: Payer: Self-pay | Admitting: *Deleted

## 2013-12-29 NOTE — Telephone Encounter (Signed)
REFERRAL SUBMITTED BY DR. Rolena Infante FOR SOONER F/U THAN November. Left msg for patient to return call  Dr. Jannifer Franklin has office visit on Friday @ 2:30.

## 2014-01-06 ENCOUNTER — Encounter: Payer: Self-pay | Admitting: Neurology

## 2014-01-06 ENCOUNTER — Encounter (INDEPENDENT_AMBULATORY_CARE_PROVIDER_SITE_OTHER): Payer: Self-pay

## 2014-01-06 ENCOUNTER — Ambulatory Visit (INDEPENDENT_AMBULATORY_CARE_PROVIDER_SITE_OTHER): Payer: BC Managed Care – PPO | Admitting: Neurology

## 2014-01-06 VITALS — BP 110/68 | HR 66 | Wt 169.0 lb

## 2014-01-06 DIAGNOSIS — R252 Cramp and spasm: Secondary | ICD-10-CM

## 2014-01-06 DIAGNOSIS — R269 Unspecified abnormalities of gait and mobility: Secondary | ICD-10-CM

## 2014-01-06 DIAGNOSIS — G63 Polyneuropathy in diseases classified elsewhere: Secondary | ICD-10-CM

## 2014-01-06 HISTORY — DX: Polyneuropathy in diseases classified elsewhere: G63

## 2014-01-06 MED ORDER — BACLOFEN 10 MG PO TABS: 5.0000 mg | ORAL_TABLET | Freq: Two times a day (BID) | ORAL | Status: DC

## 2014-01-06 NOTE — Progress Notes (Signed)
Reason for visit: Muscle cramps  Dawn Lewis is an 58 y.o. female  History of present illness:  Dawn Lewis is a 58 year old right-handed white female with a history of diabetes. The patient has been seen at the end of July 2015 for muscle cramps that involved the lower extremities below the knees, and the left greater than right thigh. The patient now indicates that she is having muscle cramps in the hands as well. The patient indicates a change in her walking, and some difficulty with the right leg. The patient has had MRI evaluation of the cervical and lumbosacral spine that does show some degenerative changes with mild spinal stenosis at the C6-7 level with bilateral neural foraminal narrowing at this level. The patient also has mild spinal stenosis at C5-6 level and left neuroforaminal stenosis. 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 MRI of the lumbosacral spine was also done, and this reveals a disc bulge at the L5-S1 level that makes contact with the left S1 nerve root. The patient returns to this office for further evaluation. Prior nerve conduction studies did suggest a peripheral neuropathy that likely is related to diabetes. Blood work done previously was unremarkable, CK enzyme levels are normal. The patient reports that the muscle cramps are daily in nature, more likely to occur after increased physical activity during the day, and generally are worse at nighttime.  Past Medical History  Diagnosis Date  . Hiatal hernia   . Hypercholesterolemia   . Muscle cramps 11/11/2013  . Abnormality of gait 11/11/2013  . Type 1 diabetes   . Polyneuropathy in other diseases classified elsewhere 01/06/2014    Past Surgical History  Procedure Laterality Date  . Appendectomy    . Tonsillectomy    . Partial hysterectomy    . Mandible surgery      extension  . Knee arthroscopy      left meniscus repair  . Cholecystectomy    . Tail bone repair      Coccyx  fracture, subsequent resection    Family History  Problem Relation Age of Onset  . Rectal cancer Neg Hx   . Stomach cancer Neg Hx   . Stroke Mother   . Colon cancer Father   . Colon cancer Maternal Grandfather   . Colon cancer Paternal Grandfather   . Esophageal cancer Maternal Uncle     Social history:  reports that she has never smoked. She has never used smokeless tobacco. She reports that she does not drink alcohol or use illicit drugs.    Allergies  Allergen Reactions  . Aspirin   . Nsaids Hives    Can lead to anaphylactic    Medications:  Current Outpatient Prescriptions on File Prior to Visit  Medication Sig Dispense Refill  . Black Cohosh 40 MG CAPS Take by mouth.      . cetirizine (ZYRTEC) 10 MG tablet Take 10 mg by mouth daily.      Marland Kitchen estradiol (ESTRACE) 0.1 MG/GM vaginal cream Place 1 Applicatorful vaginally at bedtime.      . Evening Primrose Oil 500 MG CAPS Take 2 capsules by mouth daily.      Marland Kitchen lisinopril (PRINIVIL,ZESTRIL) 20 MG tablet Take 20 mg by mouth daily.      . Magnesium 400 MG TABS Take 400 mg by mouth 3 (three) times daily.      . Multiple Vitamin (MULTIVITAMIN) tablet Take 1 tablet by mouth daily.      Marland Kitchen  NOVOLOG 100 UNIT/ML injection Inject 100 Units into the skin daily. Insulin pump range 26units-30 units      . Omega-3 Fatty Acids (OMEGA 3 PO) Take 2,000 mg by mouth daily.       . ONE TOUCH ULTRA TEST test strip 1 each by Other route daily.      Marland Kitchen PRESCRIPTION MEDICATION Novolog insulin via PUMP for DM (28U daily on average)      . Probiotic Product (PROBIOTIC DAILY PO) Take by mouth.      . sertraline (ZOLOFT) 50 MG tablet Take 50 mg by mouth daily.      Marland Kitchen terconazole (TERAZOL 7) 0.4 % vaginal cream Place 1 applicator vaginally as needed. Daily at bedtime as needed       No current facility-administered medications on file prior to visit.    ROS:  Out of a complete 14 system review of symptoms, the patient complains only of the following  symptoms, and all other reviewed systems are negative.  Fatigue Difficulty swallowing Double vision, blurred vision Cough, chest tightness Heart murmur Heat intolerance, flushing Constipation Frequent waking, daytime sleepiness Food allergies Frequency of urination Joint pain, joint swelling, muscle cramps, walking difficulties, neck pain, neck stiffness Skin rash, itching Bruising easily Memory loss, dizziness, headache, numbness, weakness, tremors Confusion, decreased concentration  Blood pressure 110/68, pulse 66, weight 169 lb (76.658 kg).  Physical Exam  General: The patient is alert and cooperative at the time of the examination.  Neuromuscular: Range of movement of the low back is full.  Skin: No significant peripheral edema is noted.   Neurologic Exam  Mental status: The patient is oriented x 3. Mini-Mental status examination done today shows a total score 28/30.  Cranial nerves: Facial symmetry is present. Speech is normal, no aphasia or dysarthria is noted. Extraocular movements are full. Visual fields are full.  Motor: The patient has good strength in all 4 extremities. The patient has prominent giveaway weakness involving the intrinsic muscles of the hands bilaterally, and with motor testing of the legs bilaterally.  Sensory examination: Soft touch sensation is symmetric on the face, arms, and legs.  Coordination: The patient has good finger-nose-finger and heel-to-shin bilaterally.  Gait and station: The patient has an unusual gait pattern, with some hesitation at times with the right leg. Tandem gait is normal. Romberg is negative. No drift is seen.  Reflexes: Deep tendon reflexes are symmetric.   Assessment/Plan:  1. Muscle cramps  2. Peripheral neuropathy  3. Diabetes  4. Reported gait disorder  The clinical examination today shows a lot of giveaway weakness, no true weakness is seen. I am concerned that the patient may be manipulating the  neurologic examination. The patient is concerned that she may have multiple sclerosis, but muscle cramps are generally not a presenting sign of this disease. The patient is reporting some problems with her walking, mainly affecting her right leg. The patient will be sent for further blood work today, and she will be placed on low-dose baclofen for the muscle cramps. The patient is already on magnesium supplementation. The patient will be sent for MRI evaluation of the brain. She will followup in about 4 months. I do not believe that the MRI findings of the cervical and lumbosacral spine are related to her current complaints.  Jill Alexanders MD 01/06/2014 8:25 PM  [facility] [address] [place], [place] [address]  Phone [phone] Fax [phone]

## 2014-01-06 NOTE — Patient Instructions (Signed)

## 2014-01-11 LAB — COMPREHENSIVE METABOLIC PANEL
ALT: 18 IU/L (ref 0–32)
AST: 23 IU/L (ref 0–40)
Albumin/Globulin Ratio: 2.1 (ref 1.1–2.5)
Albumin: 4.2 g/dL (ref 3.5–5.5)
Alkaline Phosphatase: 61 IU/L (ref 39–117)
BUN/Creatinine Ratio: 11 (ref 9–23)
BUN: 9 mg/dL (ref 6–24)
CALCIUM: 9 mg/dL (ref 8.7–10.2)
CO2: 24 mmol/L (ref 18–29)
CREATININE: 0.79 mg/dL (ref 0.57–1.00)
Chloride: 97 mmol/L (ref 97–108)
GFR calc Af Amer: 97 mL/min/{1.73_m2} (ref >59)
GFR calc non Af Amer: 84 mL/min/{1.73_m2} (ref >59)
Globulin, Total: 2 g/dL (ref 1.5–4.5)
Glucose: 173 mg/dL — ABNORMAL HIGH (ref 65–99)
POTASSIUM: 3.9 mmol/L (ref 3.5–5.2)
SODIUM: 139 mmol/L (ref 134–144)
Total Bilirubin: 0.4 mg/dL (ref 0.0–1.2)
Total Protein: 6.2 g/dL (ref 6.0–8.5)

## 2014-01-11 LAB — IFE AND PE, SERUM
Albumin SerPl Elph-Mcnc: 3.8 g/dL (ref 3.2–5.6)
Albumin/Glob SerPl: 1.6 (ref 0.7–2.0)
Alpha 1: 0.2 g/dL (ref 0.1–0.4)
Alpha2 Glob SerPl Elph-Mcnc: 0.6 g/dL (ref 0.4–1.2)
B-GLOBULIN SERPL ELPH-MCNC: 0.7 g/dL (ref 0.6–1.3)
GAMMA GLOB SERPL ELPH-MCNC: 0.9 g/dL (ref 0.5–1.6)
GLOBULIN, TOTAL: 2.4 g/dL (ref 2.0–4.5)
IgA/Immunoglobulin A, Serum: 70 mg/dL — ABNORMAL LOW (ref 91–414)
IgG (Immunoglobin G), Serum: 817 mg/dL (ref 700–1600)
IgM (Immunoglobulin M), Srm: 291 mg/dL — ABNORMAL HIGH (ref 40–230)

## 2014-01-11 LAB — HIV ANTIBODY (ROUTINE TESTING W REFLEX): HIV 1/HIV 2 AB: NONREACTIVE

## 2014-01-11 LAB — COPPER, SERUM: Copper: 113 ug/dL (ref 72–166)

## 2014-01-12 ENCOUNTER — Telehealth: Payer: Self-pay | Admitting: *Deleted

## 2014-01-12 NOTE — Telephone Encounter (Signed)
Please call the patient. Blood work is unremarkable with exception that the glucose is slightly high at 173. Patient aware.

## 2014-01-13 ENCOUNTER — Encounter (INDEPENDENT_AMBULATORY_CARE_PROVIDER_SITE_OTHER): Payer: Self-pay

## 2014-01-13 ENCOUNTER — Ambulatory Visit (INDEPENDENT_AMBULATORY_CARE_PROVIDER_SITE_OTHER): Payer: BC Managed Care – PPO

## 2014-01-13 DIAGNOSIS — G63 Polyneuropathy in diseases classified elsewhere: Secondary | ICD-10-CM

## 2014-01-13 DIAGNOSIS — R252 Cramp and spasm: Secondary | ICD-10-CM

## 2014-01-13 DIAGNOSIS — R269 Unspecified abnormalities of gait and mobility: Secondary | ICD-10-CM

## 2014-01-16 ENCOUNTER — Telehealth: Payer: Self-pay | Admitting: Neurology

## 2014-01-16 NOTE — Telephone Encounter (Signed)
  I called the patient. The MRI of the brain is normal. She is to continue to increase the baclofen, as she is still having cramps and she is tolerating the medication so far. Will go to 10 mg BID of the baclofen.  MRI brain 01/14/14:  Impression   Normal MRI scan of the brain without contrast

## 2014-02-24 ENCOUNTER — Ambulatory Visit: Payer: Self-pay | Admitting: Neurology

## 2014-05-12 ENCOUNTER — Ambulatory Visit (INDEPENDENT_AMBULATORY_CARE_PROVIDER_SITE_OTHER): Payer: BLUE CROSS/BLUE SHIELD | Admitting: Neurology

## 2014-05-12 ENCOUNTER — Encounter: Payer: Self-pay | Admitting: Neurology

## 2014-05-12 VITALS — BP 95/59 | HR 66 | Ht 70.0 in | Wt 170.6 lb

## 2014-05-12 DIAGNOSIS — R252 Cramp and spasm: Secondary | ICD-10-CM

## 2014-05-12 DIAGNOSIS — G63 Polyneuropathy in diseases classified elsewhere: Secondary | ICD-10-CM

## 2014-05-12 NOTE — Patient Instructions (Signed)

## 2014-05-12 NOTE — Progress Notes (Signed)
Reason for visit: Peripheral neuropathy  Dawn Lewis is an 59 y.o. female  History of present illness:  Dawn Lewis is a 59 year old white female with a history of type 1 diabetes associated with a peripheral neuropathy. The patient does report frequent nocturnal leg cramps, particularly after days where she has been very active. The patient has gotten epidural steroid injections in the neck and low back with good improvement in leg strength. The patient occasionally will have some tremors in the hands. She has had EMG and nerve conduction study evaluation that confirms the presence of a peripheral neuropathy. She denies any falls. She has been on baclofen 10 mg at night for the muscle cramps, but this has not been completely effective. She is also on magnesium supplementation.  Past Medical History  Diagnosis Date  . Hiatal hernia   . Hypercholesterolemia   . Muscle cramps 11/11/2013  . Abnormality of gait 11/11/2013  . Type 1 diabetes   . Polyneuropathy in other diseases classified elsewhere 01/06/2014    Past Surgical History  Procedure Laterality Date  . Appendectomy    . Tonsillectomy    . Partial hysterectomy    . Mandible surgery      extension  . Knee arthroscopy      left meniscus repair  . Cholecystectomy    . Tail bone repair      Coccyx fracture, subsequent resection    Family History  Problem Relation Age of Onset  . Rectal cancer Neg Hx   . Stomach cancer Neg Hx   . Stroke Mother   . Colon cancer Father   . Colon cancer Maternal Grandfather   . Colon cancer Paternal Grandfather   . Esophageal cancer Maternal Uncle     Social history:  reports that she has never smoked. She has never used smokeless tobacco. She reports that she does not drink alcohol or use illicit drugs.    Allergies  Allergen Reactions  . Aspirin   . Nsaids Hives    Can lead to anaphylactic    Medications:  Current Outpatient Prescriptions on File Prior to Visit  Medication  Sig Dispense Refill  . baclofen (LIORESAL) 10 MG tablet Take 20 mg by mouth at bedtime as needed.     Marland Kitchen BIOTIN PO Take 1 capsule by mouth daily. Patient unsure of mg.    . Black Cohosh 40 MG CAPS Take by mouth.    . cetirizine (ZYRTEC) 10 MG tablet Take 10 mg by mouth daily.    Marland Kitchen estradiol (ESTRACE) 0.1 MG/GM vaginal cream Place 1 Applicatorful vaginally at bedtime.    . Evening Primrose Oil 500 MG CAPS Take 2 capsules by mouth daily.    Marland Kitchen glucosamine-chondroitin 500-400 MG tablet Take 1 tablet by mouth 2 (two) times daily.    Marland Kitchen lisinopril (PRINIVIL,ZESTRIL) 20 MG tablet Take 20 mg by mouth daily.    . Magnesium 400 MG TABS Take 400 mg by mouth 3 (three) times daily.    . Misc Natural Products (TART CHERRY ADVANCED) CAPS Take 4 capsules by mouth daily.    . Multiple Vitamin (MULTIVITAMIN) tablet Take 1 tablet by mouth daily.    Marland Kitchen NOVOLOG 100 UNIT/ML injection Inject 100 Units into the skin daily. Insulin pump range 26units-30 units    . Omega-3 Fatty Acids (OMEGA 3 PO) Take 2,000 mg by mouth daily.     . ONE TOUCH ULTRA TEST test strip 1 each by Other route daily.    Marland Kitchen  PRESCRIPTION MEDICATION Novolog insulin via PUMP for DM (28U daily on average)    . Probiotic Product (PROBIOTIC DAILY PO) Take by mouth.    . sertraline (ZOLOFT) 50 MG tablet Take 50 mg by mouth daily.    Marland Kitchen terconazole (TERAZOL 7) 0.4 % vaginal cream Place 1 applicator vaginally as needed. Daily at bedtime as needed     No current facility-administered medications on file prior to visit.    ROS:  Out of a complete 14 system review of symptoms, the patient complains only of the following symptoms, and all other reviewed systems are negative.  Excessive sweating Eye redness, blurred vision Heat intolerance Constipation Frequent waking Food allergies Frequency of urination Back pain, muscle cramps, neck pain, neck stiffness Bruising easily Headache, numbness, tremors  Blood pressure 95/59, pulse 66, height 5\' 10"   (1.778 m), weight 170 lb 9.6 oz (77.384 kg).  Physical Exam  General: The patient is alert and cooperative at the time of the examination.  Skin: No significant peripheral edema is noted.   Neurologic Exam  Mental status: The patient is oriented x 3.  Cranial nerves: Facial symmetry is present. Speech is normal, no aphasia or dysarthria is noted. Extraocular movements are full. Visual fields are full.  Motor: The patient has good strength in all 4 extremities.  Sensory examination: Soft touch sensation is symmetric on the face, arms, and legs.  Coordination: The patient has good finger-nose-finger and heel-to-shin bilaterally.  Gait and station: The patient has a normal gait. Tandem gait is normal. Romberg is unsteady. No drift is seen.  Reflexes: Deep tendon reflexes are symmetric.   Assessment/Plan:  1. Diabetes, type I  2. Peripheral neuropathy secondary to diabetes  3. Nocturnal leg cramps  The patient will be increased on the baclofen taking 20 mg at night. If this is not effective, she is to contact our office, we will consider the use of quinine. She will follow-up in 6 months. Clinical examination today shows no weakness in the legs.  Jill Alexanders MD 05/12/2014 8:05 PM  [facility] [address] [place], [place] [address]  Phone [phone] Fax [phone]

## 2014-08-16 ENCOUNTER — Other Ambulatory Visit: Payer: Self-pay | Admitting: Obstetrics and Gynecology

## 2014-08-17 LAB — CYTOLOGY - PAP

## 2014-08-18 ENCOUNTER — Other Ambulatory Visit: Payer: Self-pay | Admitting: Obstetrics and Gynecology

## 2014-08-18 DIAGNOSIS — R928 Other abnormal and inconclusive findings on diagnostic imaging of breast: Secondary | ICD-10-CM

## 2014-08-26 ENCOUNTER — Ambulatory Visit
Admission: RE | Admit: 2014-08-26 | Discharge: 2014-08-26 | Disposition: A | Discharge status: Home / Self Care | Payer: BLUE CROSS/BLUE SHIELD | Source: Ambulatory Visit | Attending: Obstetrics and Gynecology | Admitting: Obstetrics and Gynecology

## 2014-08-26 ENCOUNTER — Other Ambulatory Visit: Payer: Self-pay | Admitting: Obstetrics and Gynecology

## 2014-08-26 DIAGNOSIS — R928 Other abnormal and inconclusive findings on diagnostic imaging of breast: Secondary | ICD-10-CM

## 2014-08-26 DIAGNOSIS — N6310 Unspecified lump in the right breast, unspecified quadrant: Secondary | ICD-10-CM

## 2014-08-31 ENCOUNTER — Other Ambulatory Visit: Payer: Self-pay | Admitting: Obstetrics and Gynecology

## 2014-08-31 DIAGNOSIS — N6310 Unspecified lump in the right breast, unspecified quadrant: Secondary | ICD-10-CM

## 2014-09-01 ENCOUNTER — Ambulatory Visit
Admission: RE | Admit: 2014-09-01 | Discharge: 2014-09-01 | Disposition: A | Discharge status: Home / Self Care | Payer: BLUE CROSS/BLUE SHIELD | Source: Ambulatory Visit | Attending: Obstetrics and Gynecology | Admitting: Obstetrics and Gynecology

## 2014-09-01 ENCOUNTER — Other Ambulatory Visit: Payer: Self-pay | Admitting: Obstetrics and Gynecology

## 2014-09-01 DIAGNOSIS — N6310 Unspecified lump in the right breast, unspecified quadrant: Secondary | ICD-10-CM

## 2014-11-11 ENCOUNTER — Encounter: Payer: Self-pay | Admitting: Internal Medicine

## 2014-11-15 ENCOUNTER — Encounter: Payer: Self-pay | Admitting: Adult Health

## 2014-11-15 ENCOUNTER — Ambulatory Visit (INDEPENDENT_AMBULATORY_CARE_PROVIDER_SITE_OTHER): Payer: BLUE CROSS/BLUE SHIELD | Admitting: Adult Health

## 2014-11-15 VITALS — BP 106/70 | HR 62 | Ht 70.0 in | Wt 161.0 lb

## 2014-11-15 DIAGNOSIS — G4762 Sleep related leg cramps: Secondary | ICD-10-CM | POA: Diagnosis not present

## 2014-11-15 DIAGNOSIS — E1042 Type 1 diabetes mellitus with diabetic polyneuropathy: Secondary | ICD-10-CM | POA: Diagnosis not present

## 2014-11-15 DIAGNOSIS — G990 Autonomic neuropathy in diseases classified elsewhere: Secondary | ICD-10-CM

## 2014-11-15 NOTE — Progress Notes (Signed)
I have read the note, and I agree with the clinical assessment and plan.  WILLIS,CHARLES KEITH   

## 2014-11-15 NOTE — Patient Instructions (Addendum)
Continue baclofen 20 mg at bedtime as needed. If your symptoms worsen or you develop new symptoms please let us know.

## 2014-11-15 NOTE — Progress Notes (Addendum)
PATIENT: Dawn Lewis DOB: [birth year]  REASON FOR VISIT: follow up- type 1 diabetes associated with a peripheral neuropathy, nocturnal leg cramps HISTORY FROM: patient  HISTORY OF PRESENT ILLNESS: Dawn Lewis is a 59 year old female with a history of type 1 diabetes associated with a peripheral neuropathy as well as nocturnal leg cramps. She returns today for an evaluation. At the last visit the patient's baclofen was increased to 20 mg at bedtime. She reports that this has been beneficial. She states that she does not have to take the medication every night. She states more often if she is very active during the day that she knows that she will need a medication that night. She has also noticed that drinking a tablespoon or so of vinegar also subsides her cramps. She states that tonic water also does the same thing. The patient denies any significant discomfort associated with her neuropathy. She states that occasionally she'll feel tingling on the lateral aspect of the left leg but only to touch. She states that her balance has progressively gotten slightly worse. She denies any falls. Does not use a cane or walker when ambulating. She just recently had a steroid injection in the low back she reports that this significantly helps the pain that within the left leg.  Denies any new symptoms. She returns today for an evaluation.  HISTORY 05/12/14 (WILLIS): Dawn Lewis is a 59 year old white female with a history of type 1 diabetes associated with a peripheral neuropathy. The patient does report frequent nocturnal leg cramps, particularly after days where she has been very active. The patient has gotten epidural steroid injections in the neck and low back with good improvement in leg strength. The patient occasionally will have some tremors in the hands. She has had EMG and nerve conduction study evaluation that confirms the presence of a peripheral neuropathy. She denies any falls. She has been on  baclofen 10 mg at night for the muscle cramps, but this has not been completely effective. She is also on magnesium supplementation.  REVIEW OF SYSTEMS: Out of a complete 14 system review of symptoms, the patient complains only of the following symptoms, and all other reviewed systems are negative.  See history of present illness ALLERGIES: Allergies  Allergen Reactions  . Aspirin   . Nsaids Hives    Can lead to anaphylactic    HOME MEDICATIONS: Outpatient Prescriptions Prior to Visit  Medication Sig Dispense Refill  . baclofen (LIORESAL) 10 MG tablet Take 20 mg by mouth at bedtime as needed.     Marland Kitchen BIOTIN PO Take 1 capsule by mouth daily. Patient unsure of mg.    . Black Cohosh 40 MG CAPS Take by mouth.    . cetirizine (ZYRTEC) 10 MG tablet Take 10 mg by mouth daily.    Marland Kitchen estradiol (ESTRACE) 0.1 MG/GM vaginal cream Place 1 Applicatorful vaginally at bedtime.    . Evening Primrose Oil 500 MG CAPS Take 2 capsules by mouth daily.    Marland Kitchen glucosamine-chondroitin 500-400 MG tablet Take 1 tablet by mouth 2 (two) times daily.    Marland Kitchen lisinopril (PRINIVIL,ZESTRIL) 20 MG tablet Take 20 mg by mouth daily.    . Magnesium 400 MG TABS Take 400 mg by mouth 3 (three) times daily.    . Misc Natural Products (TART CHERRY ADVANCED) CAPS Take 4 capsules by mouth daily.    . Multiple Vitamin (MULTIVITAMIN) tablet Take 1 tablet by mouth daily.    Marland Kitchen NOVOLOG 100 UNIT/ML injection Inject  100 Units into the skin daily. Insulin pump range 26units-30 units    . Omega-3 Fatty Acids (OMEGA 3 PO) Take 2,000 mg by mouth daily.     . ONE TOUCH ULTRA TEST test strip 1 each by Other route daily.    Marland Kitchen PRESCRIPTION MEDICATION Novolog insulin via PUMP for DM (28U daily on average)    . Probiotic Product (PROBIOTIC DAILY PO) Take by mouth.    . sertraline (ZOLOFT) 50 MG tablet Take 50 mg by mouth daily.    Marland Kitchen terconazole (TERAZOL 7) 0.4 % vaginal cream Place 1 applicator vaginally as needed. Daily at bedtime as needed     No  facility-administered medications prior to visit.    PAST MEDICAL HISTORY: Past Medical History  Diagnosis Date  . Hiatal hernia   . Hypercholesterolemia   . Muscle cramps 11/11/2013  . Abnormality of gait 11/11/2013  . Type 1 diabetes   . Polyneuropathy in other diseases classified elsewhere 01/06/2014    PAST SURGICAL HISTORY: Past Surgical History  Procedure Laterality Date  . Appendectomy    . Tonsillectomy    . Partial hysterectomy    . Mandible surgery      extension  . Knee arthroscopy      left meniscus repair  . Cholecystectomy    . Tail bone repair      Coccyx fracture, subsequent resection    FAMILY HISTORY: Family History  Problem Relation Age of Onset  . Rectal cancer Neg Hx   . Stomach cancer Neg Hx   . Stroke Mother   . Colon cancer Father   . Colon cancer Maternal Grandfather   . Colon cancer Paternal Grandfather   . Esophageal cancer Maternal Uncle     SOCIAL HISTORY: History   Social History  . Marital Status: Married    Spouse Name: N/A  . Number of Children: 3  . Years of Education: MA   Occupational History  . housewife    Social History Main Topics  . Smoking status: Never Smoker   . Smokeless tobacco: Never Used  . Alcohol Use: No  . Drug Use: No  . Sexual Activity: Not on file   Other Topics Concern  . Not on file   Social History Narrative   Patient is right handed   Patient drinks some caffeine weekly.      PHYSICAL EXAM  Filed Vitals:   11/15/14 1053  BP: 106/70  Pulse: 62  Height: 5\' 10"  (1.778 m)  Weight: 161 lb (73.029 kg)   Body mass index is 23.1 kg/(m^2).  Generalized: Well developed, in no acute distress   Neurological examination  Mentation: Alert oriented to time, place, history taking. Follows all commands speech and language fluent Cranial nerve II-XII: Pupils were equal round reactive to light. Extraocular movements were full, visual field were full on confrontational test. Facial sensation and  strength were normal. Uvula tongue midline. Head turning and shoulder shrug  were normal and symmetric. Motor: The motor testing reveals 5 over 5 strength of all 4 extremities. Good symmetric motor tone is noted throughout.  Sensory: Sensory testing is intact to soft touch on all 4 extremities. No evidence of extinction is noted.  Coordination: Cerebellar testing reveals good finger-nose-finger and heel-to-shin bilaterally.  Gait and station: Gait is normal. Tandem gait is slightly unsteady. Romberg is positive. Reflexes: Deep tendon reflexes are symmetric and normal in the upper extremities. Patellar reflexes absent in the right leg- patient states that it has been this  way.  DIAGNOSTIC DATA (LABS, IMAGING, TESTING) - I reviewed patient records, labs, notes, testing and imaging myself where available.      ASSESSMENT AND PLAN 59 y.o. year old female  has a past medical history of Hiatal hernia; Hypercholesterolemia; Muscle cramps (11/11/2013); Abnormality of gait (11/11/2013); Type 1 diabetes; and Polyneuropathy in other diseases classified elsewhere (01/06/2014). here with:  1. Type 1 diabetes associated with a peripheral neuropathy 2. Nocturnal leg cramps  Overall the patient is doing well. She will continue baclofen 20 mg at bedtime as needed for muscle cramps. At this time she does not have any significant discomfort associated with peripheral neuropathy. Patient advised that if her symptoms worsen or she develops new symptoms she should let us know. Otherwise she will follow-up in 6 months or sooner if needed.     Ward Givens, MSN, NP-C 11/15/2014, 10:45 AM [facility] [address] [place], [place] [address] [phone]  Note: This document was prepared with digital dictation and possible smart phrase technology. Any transcriptional errors that result from this process are unintentional.

## 2015-05-18 ENCOUNTER — Ambulatory Visit: Payer: BLUE CROSS/BLUE SHIELD | Admitting: Adult Health

## 2015-09-21 DIAGNOSIS — Z1231 Encounter for screening mammogram for malignant neoplasm of breast: Secondary | ICD-10-CM | POA: Diagnosis not present

## 2015-09-21 DIAGNOSIS — Z6823 Body mass index (BMI) 23.0-23.9, adult: Secondary | ICD-10-CM | POA: Diagnosis not present

## 2015-09-21 DIAGNOSIS — Z01419 Encounter for gynecological examination (general) (routine) without abnormal findings: Secondary | ICD-10-CM | POA: Diagnosis not present

## 2015-10-26 DIAGNOSIS — Z13820 Encounter for screening for osteoporosis: Secondary | ICD-10-CM | POA: Diagnosis not present

## 2015-10-26 DIAGNOSIS — E1140 Type 2 diabetes mellitus with diabetic neuropathy, unspecified: Secondary | ICD-10-CM | POA: Diagnosis not present

## 2015-10-26 DIAGNOSIS — M8580 Other specified disorders of bone density and structure, unspecified site: Secondary | ICD-10-CM | POA: Diagnosis not present

## 2015-10-26 DIAGNOSIS — E038 Other specified hypothyroidism: Secondary | ICD-10-CM | POA: Diagnosis not present

## 2015-10-26 DIAGNOSIS — E1040 Type 1 diabetes mellitus with diabetic neuropathy, unspecified: Secondary | ICD-10-CM | POA: Diagnosis not present

## 2015-10-26 DIAGNOSIS — N958 Other specified menopausal and perimenopausal disorders: Secondary | ICD-10-CM | POA: Diagnosis not present

## 2015-10-26 DIAGNOSIS — F321 Major depressive disorder, single episode, moderate: Secondary | ICD-10-CM | POA: Diagnosis not present

## 2015-10-26 DIAGNOSIS — E7800 Pure hypercholesterolemia, unspecified: Secondary | ICD-10-CM | POA: Diagnosis not present

## 2015-10-26 DIAGNOSIS — M8588 Other specified disorders of bone density and structure, other site: Secondary | ICD-10-CM | POA: Diagnosis not present

## 2015-10-27 DIAGNOSIS — E109 Type 1 diabetes mellitus without complications: Secondary | ICD-10-CM | POA: Diagnosis not present

## 2015-11-14 DIAGNOSIS — Z4681 Encounter for fitting and adjustment of insulin pump: Secondary | ICD-10-CM | POA: Diagnosis not present

## 2015-11-14 DIAGNOSIS — E1040 Type 1 diabetes mellitus with diabetic neuropathy, unspecified: Secondary | ICD-10-CM | POA: Diagnosis not present

## 2015-11-14 DIAGNOSIS — Z6824 Body mass index (BMI) 24.0-24.9, adult: Secondary | ICD-10-CM | POA: Diagnosis not present

## 2015-12-06 DIAGNOSIS — E109 Type 1 diabetes mellitus without complications: Secondary | ICD-10-CM | POA: Diagnosis not present

## 2015-12-15 DIAGNOSIS — E559 Vitamin D deficiency, unspecified: Secondary | ICD-10-CM | POA: Diagnosis not present

## 2015-12-26 DIAGNOSIS — C4441 Basal cell carcinoma of skin of scalp and neck: Secondary | ICD-10-CM | POA: Diagnosis not present

## 2015-12-26 DIAGNOSIS — D225 Melanocytic nevi of trunk: Secondary | ICD-10-CM | POA: Diagnosis not present

## 2015-12-26 DIAGNOSIS — D692 Other nonthrombocytopenic purpura: Secondary | ICD-10-CM | POA: Diagnosis not present

## 2015-12-26 DIAGNOSIS — L820 Inflamed seborrheic keratosis: Secondary | ICD-10-CM | POA: Diagnosis not present

## 2015-12-26 DIAGNOSIS — L814 Other melanin hyperpigmentation: Secondary | ICD-10-CM | POA: Diagnosis not present

## 2015-12-26 DIAGNOSIS — L821 Other seborrheic keratosis: Secondary | ICD-10-CM | POA: Diagnosis not present

## 2016-01-02 DIAGNOSIS — Z6824 Body mass index (BMI) 24.0-24.9, adult: Secondary | ICD-10-CM | POA: Diagnosis not present

## 2016-01-02 DIAGNOSIS — E7800 Pure hypercholesterolemia, unspecified: Secondary | ICD-10-CM | POA: Diagnosis not present

## 2016-01-02 DIAGNOSIS — E1040 Type 1 diabetes mellitus with diabetic neuropathy, unspecified: Secondary | ICD-10-CM | POA: Diagnosis not present

## 2016-01-02 DIAGNOSIS — Z4681 Encounter for fitting and adjustment of insulin pump: Secondary | ICD-10-CM | POA: Diagnosis not present

## 2016-01-05 DIAGNOSIS — D235 Other benign neoplasm of skin of trunk: Secondary | ICD-10-CM | POA: Diagnosis not present

## 2016-01-05 DIAGNOSIS — C4441 Basal cell carcinoma of skin of scalp and neck: Secondary | ICD-10-CM | POA: Diagnosis not present

## 2016-01-23 DIAGNOSIS — E1040 Type 1 diabetes mellitus with diabetic neuropathy, unspecified: Secondary | ICD-10-CM | POA: Diagnosis not present

## 2016-01-29 DIAGNOSIS — M9905 Segmental and somatic dysfunction of pelvic region: Secondary | ICD-10-CM | POA: Diagnosis not present

## 2016-01-29 DIAGNOSIS — M461 Sacroiliitis, not elsewhere classified: Secondary | ICD-10-CM | POA: Diagnosis not present

## 2016-01-29 DIAGNOSIS — M50322 Other cervical disc degeneration at C5-C6 level: Secondary | ICD-10-CM | POA: Diagnosis not present

## 2016-01-29 DIAGNOSIS — M9901 Segmental and somatic dysfunction of cervical region: Secondary | ICD-10-CM | POA: Diagnosis not present

## 2016-01-30 DIAGNOSIS — E109 Type 1 diabetes mellitus without complications: Secondary | ICD-10-CM | POA: Diagnosis not present

## 2016-02-01 DIAGNOSIS — M50322 Other cervical disc degeneration at C5-C6 level: Secondary | ICD-10-CM | POA: Diagnosis not present

## 2016-02-01 DIAGNOSIS — M9905 Segmental and somatic dysfunction of pelvic region: Secondary | ICD-10-CM | POA: Diagnosis not present

## 2016-02-01 DIAGNOSIS — M461 Sacroiliitis, not elsewhere classified: Secondary | ICD-10-CM | POA: Diagnosis not present

## 2016-02-01 DIAGNOSIS — M9901 Segmental and somatic dysfunction of cervical region: Secondary | ICD-10-CM | POA: Diagnosis not present

## 2016-02-05 DIAGNOSIS — M461 Sacroiliitis, not elsewhere classified: Secondary | ICD-10-CM | POA: Diagnosis not present

## 2016-02-05 DIAGNOSIS — M50322 Other cervical disc degeneration at C5-C6 level: Secondary | ICD-10-CM | POA: Diagnosis not present

## 2016-02-05 DIAGNOSIS — M9905 Segmental and somatic dysfunction of pelvic region: Secondary | ICD-10-CM | POA: Diagnosis not present

## 2016-02-05 DIAGNOSIS — M9901 Segmental and somatic dysfunction of cervical region: Secondary | ICD-10-CM | POA: Diagnosis not present

## 2016-02-06 DIAGNOSIS — M7989 Other specified soft tissue disorders: Secondary | ICD-10-CM | POA: Diagnosis not present

## 2016-02-06 DIAGNOSIS — M4302 Spondylolysis, cervical region: Secondary | ICD-10-CM | POA: Diagnosis not present

## 2016-02-06 DIAGNOSIS — Z794 Long term (current) use of insulin: Secondary | ICD-10-CM | POA: Diagnosis not present

## 2016-02-06 DIAGNOSIS — E1040 Type 1 diabetes mellitus with diabetic neuropathy, unspecified: Secondary | ICD-10-CM | POA: Diagnosis not present

## 2016-02-09 DIAGNOSIS — M9905 Segmental and somatic dysfunction of pelvic region: Secondary | ICD-10-CM | POA: Diagnosis not present

## 2016-02-09 DIAGNOSIS — M461 Sacroiliitis, not elsewhere classified: Secondary | ICD-10-CM | POA: Diagnosis not present

## 2016-02-09 DIAGNOSIS — M50322 Other cervical disc degeneration at C5-C6 level: Secondary | ICD-10-CM | POA: Diagnosis not present

## 2016-02-09 DIAGNOSIS — M9901 Segmental and somatic dysfunction of cervical region: Secondary | ICD-10-CM | POA: Diagnosis not present

## 2016-02-12 DIAGNOSIS — M9901 Segmental and somatic dysfunction of cervical region: Secondary | ICD-10-CM | POA: Diagnosis not present

## 2016-02-12 DIAGNOSIS — M50322 Other cervical disc degeneration at C5-C6 level: Secondary | ICD-10-CM | POA: Diagnosis not present

## 2016-02-12 DIAGNOSIS — M9905 Segmental and somatic dysfunction of pelvic region: Secondary | ICD-10-CM | POA: Diagnosis not present

## 2016-02-12 DIAGNOSIS — M461 Sacroiliitis, not elsewhere classified: Secondary | ICD-10-CM | POA: Diagnosis not present

## 2016-02-14 DIAGNOSIS — M9901 Segmental and somatic dysfunction of cervical region: Secondary | ICD-10-CM | POA: Diagnosis not present

## 2016-02-14 DIAGNOSIS — M9905 Segmental and somatic dysfunction of pelvic region: Secondary | ICD-10-CM | POA: Diagnosis not present

## 2016-02-14 DIAGNOSIS — M461 Sacroiliitis, not elsewhere classified: Secondary | ICD-10-CM | POA: Diagnosis not present

## 2016-02-14 DIAGNOSIS — M50322 Other cervical disc degeneration at C5-C6 level: Secondary | ICD-10-CM | POA: Diagnosis not present

## 2016-02-20 DIAGNOSIS — M9903 Segmental and somatic dysfunction of lumbar region: Secondary | ICD-10-CM | POA: Diagnosis not present

## 2016-02-20 DIAGNOSIS — M5136 Other intervertebral disc degeneration, lumbar region: Secondary | ICD-10-CM | POA: Diagnosis not present

## 2016-02-20 DIAGNOSIS — M5030 Other cervical disc degeneration, unspecified cervical region: Secondary | ICD-10-CM | POA: Diagnosis not present

## 2016-02-20 DIAGNOSIS — M9901 Segmental and somatic dysfunction of cervical region: Secondary | ICD-10-CM | POA: Diagnosis not present

## 2016-02-22 DIAGNOSIS — M9903 Segmental and somatic dysfunction of lumbar region: Secondary | ICD-10-CM | POA: Diagnosis not present

## 2016-02-22 DIAGNOSIS — M5030 Other cervical disc degeneration, unspecified cervical region: Secondary | ICD-10-CM | POA: Diagnosis not present

## 2016-02-22 DIAGNOSIS — M9901 Segmental and somatic dysfunction of cervical region: Secondary | ICD-10-CM | POA: Diagnosis not present

## 2016-02-22 DIAGNOSIS — M5136 Other intervertebral disc degeneration, lumbar region: Secondary | ICD-10-CM | POA: Diagnosis not present

## 2016-02-26 DIAGNOSIS — M5136 Other intervertebral disc degeneration, lumbar region: Secondary | ICD-10-CM | POA: Diagnosis not present

## 2016-02-26 DIAGNOSIS — M9903 Segmental and somatic dysfunction of lumbar region: Secondary | ICD-10-CM | POA: Diagnosis not present

## 2016-02-26 DIAGNOSIS — M5030 Other cervical disc degeneration, unspecified cervical region: Secondary | ICD-10-CM | POA: Diagnosis not present

## 2016-02-26 DIAGNOSIS — M9901 Segmental and somatic dysfunction of cervical region: Secondary | ICD-10-CM | POA: Diagnosis not present

## 2016-02-27 DIAGNOSIS — M9901 Segmental and somatic dysfunction of cervical region: Secondary | ICD-10-CM | POA: Diagnosis not present

## 2016-02-27 DIAGNOSIS — M5030 Other cervical disc degeneration, unspecified cervical region: Secondary | ICD-10-CM | POA: Diagnosis not present

## 2016-02-27 DIAGNOSIS — M5136 Other intervertebral disc degeneration, lumbar region: Secondary | ICD-10-CM | POA: Diagnosis not present

## 2016-02-27 DIAGNOSIS — M9903 Segmental and somatic dysfunction of lumbar region: Secondary | ICD-10-CM | POA: Diagnosis not present

## 2016-02-28 DIAGNOSIS — E109 Type 1 diabetes mellitus without complications: Secondary | ICD-10-CM | POA: Diagnosis not present

## 2016-02-29 DIAGNOSIS — M5030 Other cervical disc degeneration, unspecified cervical region: Secondary | ICD-10-CM | POA: Diagnosis not present

## 2016-02-29 DIAGNOSIS — M9903 Segmental and somatic dysfunction of lumbar region: Secondary | ICD-10-CM | POA: Diagnosis not present

## 2016-02-29 DIAGNOSIS — M9901 Segmental and somatic dysfunction of cervical region: Secondary | ICD-10-CM | POA: Diagnosis not present

## 2016-02-29 DIAGNOSIS — M5136 Other intervertebral disc degeneration, lumbar region: Secondary | ICD-10-CM | POA: Diagnosis not present

## 2016-03-04 DIAGNOSIS — M5030 Other cervical disc degeneration, unspecified cervical region: Secondary | ICD-10-CM | POA: Diagnosis not present

## 2016-03-04 DIAGNOSIS — M5136 Other intervertebral disc degeneration, lumbar region: Secondary | ICD-10-CM | POA: Diagnosis not present

## 2016-03-04 DIAGNOSIS — M9901 Segmental and somatic dysfunction of cervical region: Secondary | ICD-10-CM | POA: Diagnosis not present

## 2016-03-04 DIAGNOSIS — M9903 Segmental and somatic dysfunction of lumbar region: Secondary | ICD-10-CM | POA: Diagnosis not present

## 2016-03-05 DIAGNOSIS — M5136 Other intervertebral disc degeneration, lumbar region: Secondary | ICD-10-CM | POA: Diagnosis not present

## 2016-03-05 DIAGNOSIS — M9901 Segmental and somatic dysfunction of cervical region: Secondary | ICD-10-CM | POA: Diagnosis not present

## 2016-03-05 DIAGNOSIS — M5030 Other cervical disc degeneration, unspecified cervical region: Secondary | ICD-10-CM | POA: Diagnosis not present

## 2016-03-05 DIAGNOSIS — M9903 Segmental and somatic dysfunction of lumbar region: Secondary | ICD-10-CM | POA: Diagnosis not present

## 2016-03-11 DIAGNOSIS — M5030 Other cervical disc degeneration, unspecified cervical region: Secondary | ICD-10-CM | POA: Diagnosis not present

## 2016-03-11 DIAGNOSIS — M9901 Segmental and somatic dysfunction of cervical region: Secondary | ICD-10-CM | POA: Diagnosis not present

## 2016-03-11 DIAGNOSIS — M9903 Segmental and somatic dysfunction of lumbar region: Secondary | ICD-10-CM | POA: Diagnosis not present

## 2016-03-11 DIAGNOSIS — M5136 Other intervertebral disc degeneration, lumbar region: Secondary | ICD-10-CM | POA: Diagnosis not present

## 2016-03-12 DIAGNOSIS — M5030 Other cervical disc degeneration, unspecified cervical region: Secondary | ICD-10-CM | POA: Diagnosis not present

## 2016-03-12 DIAGNOSIS — M9901 Segmental and somatic dysfunction of cervical region: Secondary | ICD-10-CM | POA: Diagnosis not present

## 2016-03-12 DIAGNOSIS — M5136 Other intervertebral disc degeneration, lumbar region: Secondary | ICD-10-CM | POA: Diagnosis not present

## 2016-03-12 DIAGNOSIS — M9903 Segmental and somatic dysfunction of lumbar region: Secondary | ICD-10-CM | POA: Diagnosis not present

## 2016-03-15 DIAGNOSIS — M9903 Segmental and somatic dysfunction of lumbar region: Secondary | ICD-10-CM | POA: Diagnosis not present

## 2016-03-15 DIAGNOSIS — M5030 Other cervical disc degeneration, unspecified cervical region: Secondary | ICD-10-CM | POA: Diagnosis not present

## 2016-03-15 DIAGNOSIS — M5136 Other intervertebral disc degeneration, lumbar region: Secondary | ICD-10-CM | POA: Diagnosis not present

## 2016-03-15 DIAGNOSIS — M9901 Segmental and somatic dysfunction of cervical region: Secondary | ICD-10-CM | POA: Diagnosis not present

## 2016-03-18 DIAGNOSIS — M9903 Segmental and somatic dysfunction of lumbar region: Secondary | ICD-10-CM | POA: Diagnosis not present

## 2016-03-18 DIAGNOSIS — M9901 Segmental and somatic dysfunction of cervical region: Secondary | ICD-10-CM | POA: Diagnosis not present

## 2016-03-18 DIAGNOSIS — M5030 Other cervical disc degeneration, unspecified cervical region: Secondary | ICD-10-CM | POA: Diagnosis not present

## 2016-03-18 DIAGNOSIS — M5136 Other intervertebral disc degeneration, lumbar region: Secondary | ICD-10-CM | POA: Diagnosis not present

## 2016-03-19 DIAGNOSIS — M9901 Segmental and somatic dysfunction of cervical region: Secondary | ICD-10-CM | POA: Diagnosis not present

## 2016-03-19 DIAGNOSIS — E1040 Type 1 diabetes mellitus with diabetic neuropathy, unspecified: Secondary | ICD-10-CM | POA: Diagnosis not present

## 2016-03-19 DIAGNOSIS — M5136 Other intervertebral disc degeneration, lumbar region: Secondary | ICD-10-CM | POA: Diagnosis not present

## 2016-03-19 DIAGNOSIS — M9903 Segmental and somatic dysfunction of lumbar region: Secondary | ICD-10-CM | POA: Diagnosis not present

## 2016-03-19 DIAGNOSIS — M5030 Other cervical disc degeneration, unspecified cervical region: Secondary | ICD-10-CM | POA: Diagnosis not present

## 2016-03-19 DIAGNOSIS — Z4681 Encounter for fitting and adjustment of insulin pump: Secondary | ICD-10-CM | POA: Diagnosis not present

## 2016-03-19 DIAGNOSIS — M7989 Other specified soft tissue disorders: Secondary | ICD-10-CM | POA: Diagnosis not present

## 2016-03-19 DIAGNOSIS — Z6824 Body mass index (BMI) 24.0-24.9, adult: Secondary | ICD-10-CM | POA: Diagnosis not present

## 2016-03-22 DIAGNOSIS — M5136 Other intervertebral disc degeneration, lumbar region: Secondary | ICD-10-CM | POA: Diagnosis not present

## 2016-03-22 DIAGNOSIS — M9901 Segmental and somatic dysfunction of cervical region: Secondary | ICD-10-CM | POA: Diagnosis not present

## 2016-03-22 DIAGNOSIS — M5030 Other cervical disc degeneration, unspecified cervical region: Secondary | ICD-10-CM | POA: Diagnosis not present

## 2016-03-22 DIAGNOSIS — M9903 Segmental and somatic dysfunction of lumbar region: Secondary | ICD-10-CM | POA: Diagnosis not present

## 2016-03-25 DIAGNOSIS — M5030 Other cervical disc degeneration, unspecified cervical region: Secondary | ICD-10-CM | POA: Diagnosis not present

## 2016-03-25 DIAGNOSIS — M9901 Segmental and somatic dysfunction of cervical region: Secondary | ICD-10-CM | POA: Diagnosis not present

## 2016-03-25 DIAGNOSIS — M5136 Other intervertebral disc degeneration, lumbar region: Secondary | ICD-10-CM | POA: Diagnosis not present

## 2016-03-25 DIAGNOSIS — M9903 Segmental and somatic dysfunction of lumbar region: Secondary | ICD-10-CM | POA: Diagnosis not present

## 2016-03-27 DIAGNOSIS — M5030 Other cervical disc degeneration, unspecified cervical region: Secondary | ICD-10-CM | POA: Diagnosis not present

## 2016-03-27 DIAGNOSIS — M9901 Segmental and somatic dysfunction of cervical region: Secondary | ICD-10-CM | POA: Diagnosis not present

## 2016-03-27 DIAGNOSIS — M9903 Segmental and somatic dysfunction of lumbar region: Secondary | ICD-10-CM | POA: Diagnosis not present

## 2016-03-27 DIAGNOSIS — M5136 Other intervertebral disc degeneration, lumbar region: Secondary | ICD-10-CM | POA: Diagnosis not present

## 2016-03-28 DIAGNOSIS — M9903 Segmental and somatic dysfunction of lumbar region: Secondary | ICD-10-CM | POA: Diagnosis not present

## 2016-03-28 DIAGNOSIS — M5030 Other cervical disc degeneration, unspecified cervical region: Secondary | ICD-10-CM | POA: Diagnosis not present

## 2016-03-28 DIAGNOSIS — M5136 Other intervertebral disc degeneration, lumbar region: Secondary | ICD-10-CM | POA: Diagnosis not present

## 2016-03-28 DIAGNOSIS — M9901 Segmental and somatic dysfunction of cervical region: Secondary | ICD-10-CM | POA: Diagnosis not present

## 2016-04-03 ENCOUNTER — Encounter: Payer: Self-pay | Admitting: Cardiology

## 2016-04-03 DIAGNOSIS — E1040 Type 1 diabetes mellitus with diabetic neuropathy, unspecified: Secondary | ICD-10-CM | POA: Diagnosis not present

## 2016-05-13 DIAGNOSIS — Z794 Long term (current) use of insulin: Secondary | ICD-10-CM | POA: Diagnosis not present

## 2016-05-13 DIAGNOSIS — E1040 Type 1 diabetes mellitus with diabetic neuropathy, unspecified: Secondary | ICD-10-CM | POA: Diagnosis not present

## 2016-05-13 DIAGNOSIS — J302 Other seasonal allergic rhinitis: Secondary | ICD-10-CM | POA: Diagnosis not present

## 2016-05-13 DIAGNOSIS — E039 Hypothyroidism, unspecified: Secondary | ICD-10-CM | POA: Diagnosis not present

## 2016-05-23 ENCOUNTER — Ambulatory Visit (INDEPENDENT_AMBULATORY_CARE_PROVIDER_SITE_OTHER): Payer: BLUE CROSS/BLUE SHIELD | Admitting: Cardiology

## 2016-05-23 ENCOUNTER — Encounter: Payer: Self-pay | Admitting: Cardiology

## 2016-05-23 DIAGNOSIS — E784 Other hyperlipidemia: Secondary | ICD-10-CM

## 2016-05-23 DIAGNOSIS — I10 Essential (primary) hypertension: Secondary | ICD-10-CM

## 2016-05-23 DIAGNOSIS — E108 Type 1 diabetes mellitus with unspecified complications: Secondary | ICD-10-CM | POA: Diagnosis not present

## 2016-05-23 DIAGNOSIS — E7849 Other hyperlipidemia: Secondary | ICD-10-CM

## 2016-05-23 DIAGNOSIS — Z888 Allergy status to other drugs, medicaments and biological substances status: Secondary | ICD-10-CM

## 2016-05-23 DIAGNOSIS — R079 Chest pain, unspecified: Secondary | ICD-10-CM

## 2016-05-23 DIAGNOSIS — R269 Unspecified abnormalities of gait and mobility: Secondary | ICD-10-CM

## 2016-05-23 NOTE — Assessment & Plan Note (Signed)
Controlled.  

## 2016-05-23 NOTE — Assessment & Plan Note (Signed)
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

## 2016-05-23 NOTE — Patient Instructions (Addendum)
Medication Instructions:  DECREASE LISINOPRIL 10MG  DAILY  If you need a refill on your cardiac medications before your next appointment, please call your pharmacy.  Labwork: none  Testing/Procedures: Your physician has requested that you have a exercise myoview. For further information please visit [email or web address]. Please follow instruction sheet, as given.   Follow-Up: WE WILL CALL WITH RESULTS FROM TEST       Thank you for choosing [facility] at [facility]!!    Sharyn Lull, [place], PA-C

## 2016-05-23 NOTE — Assessment & Plan Note (Signed)
Pt has had statin intolerance leg cramps

## 2016-05-23 NOTE — Assessment & Plan Note (Signed)
Type 1 DM since age 61 with neuropathy

## 2016-05-23 NOTE — Progress Notes (Signed)
05/23/2016 Dawn Lewis   December 01, 1955  [id number]  Primary Physician Haywood Pao, MD Primary Cardiologist: Dr Percival Spanish (new)  HPI:  Pleasant 61 y/o female with a history of type 1 IDDM, referred to [place] for eva;luation of chest discomfort. The pt has noted vague "sinking feeling" when doing housework- vacuuming, and while doing exercise (Pre Cor). She has also noted some DOE that she believes is more than she should have. She has no history of CAD or prior cardiac evaluation.    Current Outpatient Prescriptions  Medication Sig Dispense Refill  . baclofen (LIORESAL) 10 MG tablet Take 20 mg by mouth at bedtime as needed.     Marland Kitchen BIOTIN PO Take 1 capsule by mouth daily. Patient unsure of mg.    . Black Cohosh 40 MG CAPS Take by mouth.    . cetirizine (ZYRTEC) 10 MG tablet Take 10 mg by mouth daily.    Marland Kitchen estradiol (ESTRACE) 0.1 MG/GM vaginal cream Place 1 Applicatorful vaginally at bedtime.    . Evening Primrose Oil 500 MG CAPS Take 2 capsules by mouth daily.    Marland Kitchen glucosamine-chondroitin 500-400 MG tablet Take 1 tablet by mouth 2 (two) times daily.    Marland Kitchen lisinopril (PRINIVIL,ZESTRIL) 20 MG tablet Take 20 mg by mouth daily.    . Magnesium 400 MG TABS Take 400 mg by mouth 3 (three) times daily.    . Misc Natural Products (TART CHERRY ADVANCED) CAPS Take 4 capsules by mouth daily.    . Multiple Vitamin (MULTIVITAMIN) tablet Take 1 tablet by mouth daily.    Marland Kitchen NOVOLOG 100 UNIT/ML injection Inject 100 Units into the skin daily. Insulin pump range 26units-30 units    . Omega-3 Fatty Acids (OMEGA 3 PO) Take 2,000 mg by mouth daily.     . ONE TOUCH ULTRA TEST test strip 1 each by Other route daily.    Marland Kitchen PRESCRIPTION MEDICATION Novolog insulin via PUMP for DM (28U daily on average)    . sertraline (ZOLOFT) 50 MG tablet Take 50 mg by mouth daily.    Marland Kitchen terconazole (TERAZOL 7) 0.4 % vaginal cream Place 1 applicator vaginally as needed. Daily at bedtime as needed     No current  facility-administered medications for this visit.     Allergies  Allergen Reactions  . Aspirin Anaphylaxis and Hives  . Nsaids Hives    Can lead to anaphylactic   Past Medical History:  Diagnosis Date  . Abnormality of gait 11/11/2013  . Hiatal hernia   . Hypercholesterolemia   . Muscle cramps 11/11/2013  . Polyneuropathy in other diseases classified elsewhere ([place]) 01/06/2014  . Type 1 diabetes [facility])     Social History   Social History  . Marital status: Married    Spouse name: Jori Moll  . Number of children: 3  . Years of education: MA   Occupational History  . housewife    Social History Main Topics  . Smoking status: Never Smoker  . Smokeless tobacco: Never Used  . Alcohol use No  . Drug use: No  . Sexual activity: Not on file   Other Topics Concern  . Not on file   Social History Narrative   Patient lives at home with her husband (Rondald) and two of her children.   Patient is a care giver.    Arboriculturist .   Right handed.   Caffeine two sodas daily.    Family History  Problem Relation Age of Onset  . Stroke  Mother   . Colon cancer Father   . Colon cancer Maternal Grandfather   . Colon cancer Paternal Grandfather   . Esophageal cancer Maternal Uncle   . Rectal cancer Neg Hx   . Stomach cancer Neg Hx     Review of Systems: General: negative for chills, fever, night sweats or weight changes.  Cardiovascular: negative for chest pain, dyspnea on exertion, edema, orthopnea, paroxysmal nocturnal dyspnea or shortness of breath Remote h/o tachycardia- self treated with valsalva Dermatological: negative for rash Respiratory: negative for cough or wheezing Urologic: negative for hematuria Abdominal: negative for nausea, vomiting, diarrhea, bright red blood per rectum, melena, or hematemesis Neurologic: negative for visual changes, syncope, or dizziness Slight weakness Rt foot secondary to back issues LE cramps QHS All other systems reviewed and are  otherwise negative except as noted above.    Blood pressure (!) 87/60, pulse 62, height 5\' 10"  (1.778 m), weight 170 lb 6.4 oz (77.3 kg).  General appearance: alert, cooperative and no distress Neck: no carotid bruit and no JVD Lungs: clear to auscultation bilaterally Heart: regular rate and rhythm Abdomen: soft, non-tender; bowel sounds normal; no masses,  no organomegaly and insulin pump Extremities: extremities normal, atraumatic, no cyanosis or edema Pulses: 2+ and symmetric Skin: Skin color, texture, turgor normal. 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   I asked her to discuss PCSK9 therapy with Dr Osborne Casco. I also suggested she cut her Lisinopril back to 10 mg- her B/P by me was 88/50 Rt arm, 84/50 Lt arm. She should hold her Lisinopril the morning her of treadmill.   Kerin Ransom PA-C 05/23/2016 8:40 AM

## 2016-05-23 NOTE — Assessment & Plan Note (Signed)
Pt c/o vague chest discomfort while doing house work and exercising as well as DOE

## 2016-05-23 NOTE — Assessment & Plan Note (Signed)
Reported hives and prior anaphylaxis with NSAIDs

## 2016-05-24 ENCOUNTER — Telehealth (HOSPITAL_COMMUNITY): Payer: Self-pay

## 2016-05-24 NOTE — Telephone Encounter (Signed)
Encounter complete. 

## 2016-05-28 ENCOUNTER — Ambulatory Visit (HOSPITAL_COMMUNITY)
Admission: RE | Admit: 2016-05-28 | Discharge: 2016-05-28 | Disposition: A | Discharge status: Home / Self Care | Payer: BLUE CROSS/BLUE SHIELD | Source: Ambulatory Visit | Attending: Cardiovascular Disease | Admitting: Cardiovascular Disease

## 2016-05-28 DIAGNOSIS — R079 Chest pain, unspecified: Secondary | ICD-10-CM | POA: Diagnosis not present

## 2016-05-28 LAB — MYOCARDIAL PERFUSION IMAGING
Estimated workload: 10.1 METS
Exercise duration (min): 8 min
Exercise duration (sec): 1 s
LV dias vol: 82 mL (ref 46–106)
LV sys vol: 22 mL
MPHR: 161 {beats}/min
Peak HR: 148 {beats}/min
Percent HR: 91 %
RPE: 18
Rest HR: 50 {beats}/min
SDS: 2
SRS: 0
SSS: 2
TID: 1

## 2016-05-28 MED ORDER — TECHNETIUM TC 99M TETROFOSMIN IV KIT
28.8000 | PACK | Freq: Once | INTRAVENOUS | Status: AC | PRN
  Administered 2016-05-28: 28.8 via INTRAVENOUS
  Filled 2016-05-28: qty 29

## 2016-05-28 MED ORDER — TECHNETIUM TC 99M TETROFOSMIN IV KIT
10.2000 | PACK | Freq: Once | INTRAVENOUS | Status: AC | PRN
  Administered 2016-05-28: 10.2 via INTRAVENOUS
  Filled 2016-05-28: qty 11

## 2016-06-06 DIAGNOSIS — M7989 Other specified soft tissue disorders: Secondary | ICD-10-CM | POA: Diagnosis not present

## 2016-06-06 DIAGNOSIS — M2550 Pain in unspecified joint: Secondary | ICD-10-CM | POA: Diagnosis not present

## 2016-06-06 DIAGNOSIS — R5382 Chronic fatigue, unspecified: Secondary | ICD-10-CM | POA: Diagnosis not present

## 2016-06-20 DIAGNOSIS — M5032 Other cervical disc degeneration, mid-cervical region, unspecified level: Secondary | ICD-10-CM | POA: Diagnosis not present

## 2016-06-20 DIAGNOSIS — M4807 Spinal stenosis, lumbosacral region: Secondary | ICD-10-CM | POA: Diagnosis not present

## 2016-06-29 DIAGNOSIS — M50323 Other cervical disc degeneration at C6-C7 level: Secondary | ICD-10-CM | POA: Diagnosis not present

## 2016-06-29 DIAGNOSIS — M50322 Other cervical disc degeneration at C5-C6 level: Secondary | ICD-10-CM | POA: Diagnosis not present

## 2016-06-29 DIAGNOSIS — M4807 Spinal stenosis, lumbosacral region: Secondary | ICD-10-CM | POA: Diagnosis not present

## 2016-07-08 DIAGNOSIS — M542 Cervicalgia: Secondary | ICD-10-CM | POA: Diagnosis not present

## 2016-07-08 DIAGNOSIS — M5126 Other intervertebral disc displacement, lumbar region: Secondary | ICD-10-CM | POA: Diagnosis not present

## 2016-07-08 DIAGNOSIS — M50322 Other cervical disc degeneration at C5-C6 level: Secondary | ICD-10-CM | POA: Diagnosis not present

## 2016-07-08 DIAGNOSIS — M4807 Spinal stenosis, lumbosacral region: Secondary | ICD-10-CM | POA: Diagnosis not present

## 2016-07-10 DIAGNOSIS — K9041 Non-celiac gluten sensitivity: Secondary | ICD-10-CM | POA: Diagnosis not present

## 2016-07-25 DIAGNOSIS — E1040 Type 1 diabetes mellitus with diabetic neuropathy, unspecified: Secondary | ICD-10-CM | POA: Diagnosis not present

## 2016-07-25 DIAGNOSIS — Z6824 Body mass index (BMI) 24.0-24.9, adult: Secondary | ICD-10-CM | POA: Diagnosis not present

## 2016-07-25 DIAGNOSIS — Z4681 Encounter for fitting and adjustment of insulin pump: Secondary | ICD-10-CM | POA: Diagnosis not present

## 2016-07-31 DIAGNOSIS — M4807 Spinal stenosis, lumbosacral region: Secondary | ICD-10-CM | POA: Diagnosis not present

## 2016-08-12 ENCOUNTER — Encounter: Payer: Self-pay | Admitting: Neurology

## 2016-08-12 ENCOUNTER — Ambulatory Visit (INDEPENDENT_AMBULATORY_CARE_PROVIDER_SITE_OTHER): Payer: BLUE CROSS/BLUE SHIELD | Admitting: Neurology

## 2016-08-12 VITALS — BP 125/76 | HR 61 | Ht 70.0 in | Wt 171.0 lb

## 2016-08-12 DIAGNOSIS — G63 Polyneuropathy in diseases classified elsewhere: Secondary | ICD-10-CM | POA: Diagnosis not present

## 2016-08-12 DIAGNOSIS — R252 Cramp and spasm: Secondary | ICD-10-CM

## 2016-08-12 DIAGNOSIS — R269 Unspecified abnormalities of gait and mobility: Secondary | ICD-10-CM

## 2016-08-12 NOTE — Progress Notes (Signed)
Reason for visit: Neuropathy  Referring physician: [name] is a 61 y.o. female  History of present illness:  Dawn Lewis is a 61 year old right-handed white female with a history of type 1 diabetes. The patient has had some problems with tingling in the ankle on the left, and she slaps her right foot when she walks. The patient has low back pain with radiation of pain down the left leg to the knee. She has had a recent MRI of the lumbar spine showing evidence of bilateral L4-5 facet joint arthritis with edema within the bone. The patient is being followed by Dr. [name] for the discomfort and she has received a nerve block that has helped her pain. The patient also reports neck discomfort without radiation down into the arms. MRI of the cervical spine as been done and shows degenerative changes that are most prominent at the C5-6 and C6-7 levels with severe bilateral neural foraminal stenosis at the C6-7 level and moderate to severe bilateral neuroforaminal stenosis at the C5-6 level. The patient indicates that she has gotten epidural steroid injections in the neck and low back in the past, but this has an adverse effect on her blood sugars. She notes some swelling in the fingers of the hands at times. She denies any recent falls, but she may stumble on occasion. In 2015 she had EMG and nerve conduction study done on both legs and right arm, EMG on the right arm and right leg. There is evidence of involvement of both peroneal nerves, EMG on the right leg did not show evidence of a lumbar radiculopathy, EMG on the right arm was normal.  Past Medical History:  Diagnosis Date  . Abnormality of gait 11/11/2013  . Hiatal hernia   . Hypercholesterolemia   . Muscle cramps 11/11/2013  . Polyneuropathy in other diseases classified elsewhere ([place]) 01/06/2014  . Type 1 diabetes [facility])     Past Surgical History:  Procedure Laterality Date  . APPENDECTOMY    . CHOLECYSTECTOMY    . KNEE  ARTHROSCOPY     left meniscus repair  . MANDIBLE SURGERY     extension  . PARTIAL HYSTERECTOMY    . tail bone repair     Coccyx fracture, subsequent resection  . TONSILLECTOMY      Family History  Problem Relation Age of Onset  . Stroke Mother   . Colon cancer Father   . Colon cancer Maternal Grandfather   . Colon cancer Paternal Grandfather   . Esophageal cancer Maternal Uncle   . Rectal cancer Neg Hx   . Stomach cancer Neg Hx     Social history:  reports that she has never smoked. She has never used smokeless tobacco. She reports that she does not drink alcohol or use drugs.  Medications:  Prior to Admission medications   Medication Sig Start Date End Date Taking? Authorizing Provider  BIOTIN PO Take 1 capsule by mouth daily. Patient unsure of mg.   Yes Historical Provider, MD  Black Cohosh 40 MG CAPS Take by mouth.   Yes Historical Provider, MD  cetirizine (ZYRTEC) 10 MG tablet Take 10 mg by mouth daily.   Yes Historical Provider, MD  estradiol (ESTRACE) 0.1 MG/GM vaginal cream Place 1 Applicatorful vaginally at bedtime.   Yes Historical Provider, MD  Evening Primrose Oil 500 MG CAPS Take 2 capsules by mouth daily.   Yes Historical Provider, MD  glucosamine-chondroitin 500-400 MG tablet Take 3 tablets by mouth  daily.    Yes Historical Provider, MD  lisinopril (PRINIVIL,ZESTRIL) 20 MG tablet Take 10 mg by mouth daily.   Yes Historical Provider, MD  Magnesium 400 MG TABS Take 400 mg by mouth daily.    Yes Historical Provider, MD  Misc Natural Products (TART CHERRY ADVANCED) CAPS Take 4 capsules by mouth daily.   Yes Historical Provider, MD  Multiple Vitamin (MULTIVITAMIN) tablet Take 1 tablet by mouth daily.   Yes Historical Provider, MD  NOVOLOG 100 UNIT/ML injection Inject 100 Units into the skin daily. Insulin pump range 26units-30 units 11/11/13  Yes Historical Provider, MD  Omega-3 Fatty Acids (OMEGA 3 PO) Take 2,000 mg by mouth daily.    Yes Historical Provider, MD  ONE  TOUCH ULTRA TEST test strip 1 each by Other route daily. 11/11/13  Yes Historical Provider, MD  PRESCRIPTION MEDICATION Novolog insulin via PUMP for DM (28U daily on average)   Yes Historical Provider, MD  sertraline (ZOLOFT) 50 MG tablet Take 50 mg by mouth daily.   Yes Historical Provider, MD  terconazole (TERAZOL 7) 0.4 % vaginal cream Place 1 applicator vaginally as needed. Daily at bedtime as needed   Yes Historical Provider, MD      Allergies  Allergen Reactions  . Aspirin Anaphylaxis and Hives  . Nsaids Hives    Can lead to anaphylactic  . Statins Other (See Comments)    Leg cramps    ROS:  Out of a complete 14 system review of symptoms, the patient complains only of the following symptoms, and all other reviewed systems are negative.  Excessive sweating Facial swelling Chest pain Eye redness Heat intolerance, excessive thirst, flushing Swollen abdomen, abdominal pain, constipation Restless legs, frequent waking, daytime sleepiness Food allergies Frequency of urination Joint pain, joint swelling, back pain, achy muscles, muscle cramps, walking difficulty, neck pain, neck stiffness Itching Bruising easily Weakness, tremors  Blood pressure 125/76, pulse 61, height 5\' 10"  (1.778 m), weight 171 lb (77.6 kg).  Physical Exam  General: The patient is alert and cooperative at the time of the examination.  Eyes: Pupils are equal, round, and reactive to light. Discs are flat bilaterally.  Neck: The neck is supple, no carotid bruits are noted.  Respiratory: The respiratory examination is clear.  Cardiovascular: The cardiovascular examination reveals a regular rate and rhythm, no obvious murmurs or rubs are noted.  Skin: Extremities are without significant edema.  Neurologic Exam  Mental status: The patient is alert and oriented x 3 at the time of the examination. The patient has apparent normal recent and remote memory, with an apparently normal attention span and  concentration ability.  Cranial nerves: Facial symmetry is present. There is good sensation of the face to pinprick and soft touch bilaterally. The strength of the facial muscles and the muscles to head turning and shoulder shrug are normal bilaterally. Speech is well enunciated, no aphasia or dysarthria is noted. Extraocular movements are full. Visual fields are full. The tongue is midline, and the patient has symmetric elevation of the soft palate. No obvious hearing deficits are noted.  Motor: The motor testing reveals 5 over 5 strength of all 4 extremities. The patient has giveaway weakness of hip flexion on the left and with dorsiflexion of the foot on the right, no true weakness was seen. Good symmetric motor tone is noted throughout.  Sensory: Sensory testing is intact to pinprick, soft touch, vibration sensation, and position sense on all 4 extremities. No evidence of extinction is noted.  Coordination: Cerebellar testing reveals good finger-nose-finger and heel-to-shin bilaterally.  Gait and station: Gait is normal. Tandem gait is normal. Romberg is negative. No drift is seen. The patient is able to walk on heels and the toes bilaterally.  Reflexes: Deep tendon reflexes are symmetric and normal bilaterally, with exception of decreased right knee jerk reflex and absent ankle jerk reflexes bilaterally. Toes are downgoing bilaterally.   Assessment/Plan:  1. Bilateral peroneal neuropathies  2. Back pain and left leg discomfort, L4-5 facet joint arthritis  3. Multilevel cervical spondylosis  The patient has had a EMG nerve conduction study done in 2015. The patient does have neuropathies mainly affecting the peroneal nerves bilaterally, this may explain the mild foot drop on the right. The patient has back pain and left leg pain that are likely related to facet joint arthritis. I see very little utility in repeating EMG and nerve conduction study at this point. The patient does not have a  lot of discomfort in the legs related to neuropathic pain. She has gained benefit with treatments through Dr. [name]. We will see her back at anytime in the future.  Jill Alexanders MD 08/12/2016 3:28 PM  [facility] [address] [place], [place] [address]  Phone [phone] Fax [phone]

## 2016-08-14 DIAGNOSIS — E109 Type 1 diabetes mellitus without complications: Secondary | ICD-10-CM | POA: Diagnosis not present

## 2016-08-14 DIAGNOSIS — K9041 Non-celiac gluten sensitivity: Secondary | ICD-10-CM | POA: Diagnosis not present

## 2016-08-16 DIAGNOSIS — M50322 Other cervical disc degeneration at C5-C6 level: Secondary | ICD-10-CM | POA: Diagnosis not present

## 2016-08-16 DIAGNOSIS — M5126 Other intervertebral disc displacement, lumbar region: Secondary | ICD-10-CM | POA: Diagnosis not present

## 2016-08-16 DIAGNOSIS — M4807 Spinal stenosis, lumbosacral region: Secondary | ICD-10-CM | POA: Diagnosis not present

## 2016-08-21 DIAGNOSIS — E7801 Familial hypercholesterolemia: Secondary | ICD-10-CM | POA: Diagnosis not present

## 2016-08-21 DIAGNOSIS — Z794 Long term (current) use of insulin: Secondary | ICD-10-CM | POA: Diagnosis not present

## 2016-08-21 DIAGNOSIS — E1140 Type 2 diabetes mellitus with diabetic neuropathy, unspecified: Secondary | ICD-10-CM | POA: Diagnosis not present

## 2016-08-21 DIAGNOSIS — E1040 Type 1 diabetes mellitus with diabetic neuropathy, unspecified: Secondary | ICD-10-CM | POA: Diagnosis not present

## 2016-08-27 ENCOUNTER — Ambulatory Visit: Payer: BLUE CROSS/BLUE SHIELD

## 2016-08-29 ENCOUNTER — Ambulatory Visit: Payer: BLUE CROSS/BLUE SHIELD | Attending: Orthopedic Surgery | Admitting: Physical Therapy

## 2016-08-29 DIAGNOSIS — G8929 Other chronic pain: Secondary | ICD-10-CM | POA: Diagnosis not present

## 2016-08-29 DIAGNOSIS — M6281 Muscle weakness (generalized): Secondary | ICD-10-CM | POA: Diagnosis not present

## 2016-08-29 DIAGNOSIS — M545 Low back pain: Secondary | ICD-10-CM | POA: Diagnosis not present

## 2016-08-29 DIAGNOSIS — M542 Cervicalgia: Secondary | ICD-10-CM | POA: Diagnosis not present

## 2016-08-29 NOTE — Patient Instructions (Addendum)
   [facility] [address], [address] [place], [place] [address] Phone # [phone] Fax [phone]  

## 2016-08-29 NOTE — Therapy (Signed)
[facility] [address] [place], [place], [address] Phone: [phone]   Fax:  [phone]  Physical Therapy Evaluation  Patient Details  Name: Dawn Lewis MRN: [id number] Date of Birth: 1955/08/21 Referring Provider: Dr. Melina Schools  Encounter Date: 08/29/2016      PT End of Session - 08/29/16 1214    Visit Number 1   Date for PT Re-Evaluation 10/24/16   PT Start Time 1125  patient arrives late and late start secondary to patient does not have her PT referral;  Called and requested referral   PT Stop Time 1215   PT Time Calculation (min) 50 min   Activity Tolerance Patient tolerated treatment well      Past Medical History:  Diagnosis Date  . Abnormality of gait 11/11/2013  . Hiatal hernia   . Hypercholesterolemia   . Muscle cramps 11/11/2013  . Polyneuropathy in other diseases classified elsewhere ([place]) 01/06/2014  . Type 1 diabetes [facility])     Past Surgical History:  Procedure Laterality Date  . APPENDECTOMY    . CHOLECYSTECTOMY    . KNEE ARTHROSCOPY     left meniscus repair  . MANDIBLE SURGERY     extension  . PARTIAL HYSTERECTOMY    . tail bone repair     Coccyx fracture, subsequent resection  . TONSILLECTOMY      There were no vitals filed for this visit.       Subjective Assessment - 08/29/16 1121    Subjective +10 years ago neck and back pain with multiple injections, with the most recent nerve block L4-5 2 weeks ago;    neck pain bilateral radiates into base of head and between shoulder blades;  pain across low back and buttocks at present but has history of right  thigh and heel pain.  She has right foot drop and left "foot drag" per patient.  She has not worn AFOs.  She reports she had an episode of extreme cramping which resulted in patellar dislocations and bilateral meniscal injuries.    arrives late, no referral   Pertinent History Type  1 diabetes with bilateral peroneal  neuropathy   Limitations House hold activities;Walking   How long can you sit comfortably? hurts immediately   How long can you walk comfortably? 1/2 block then feet drag/flop    Diagnostic tests EMG, NCV showed evidence of involvement of both peroneal nerves and did NOT show evidence of lumbar radiculopathy; EMG right arm normal;    MRI degen changes C5-6 and C6-7 with moderate to several bilatral foraminal stenosis   Patient Stated Goals Gain strength so I can walk again and increase activity;  less pain   Currently in Pain? Yes   Pain Score 4    Pain Location Neck   Pain Orientation Right;Left   Pain Type Chronic pain   Pain Onset More than a month ago   Pain Frequency Constant   Aggravating Factors  strain for bowel movements;  looking down or leaning forward;  turning left or right   Pain Relieving Factors ice   Multiple Pain Sites Yes   Pain Score 7   Pain Location Back   Pain Orientation Right;Left;Lower   Pain Type Chronic pain   Pain Onset More than a month ago   Pain Frequency Constant   Aggravating Factors  sitting            [facility] PT Assessment - 08/29/16 0001      Assessment  Medical Diagnosis neck and back pain   Referring Provider Dr. Melina Schools   Onset Date/Surgical Date --  > 6 months   Hand Dominance Right   Next MD Visit not scheduled   Prior Therapy 2 years ago for LBP at [facility] for cobra     Precautions   Precautions None     Restrictions   Weight Bearing Restrictions No     Balance Screen   Has the patient fallen in the past 6 months Yes   How many times? 3  secondary to foot drag and legs giving way   Has the patient had a decrease in activity level because of a fear of falling?  Yes   Is the patient reluctant to leave their home because of a fear of falling?  No     Home Ecologist residence   Living Arrangements Spouse/significant other     Prior Function   Vocation Part time employment   Vocation  Requirements organizing homes   Leisure used to ex and walk;   go to farm     Observation/Other Assessments   Focus on Therapeutic Outcomes (FOTO)  56% limitation     Posture/Postural Control   Posture/Postural Control Postural limitations   Postural Limitations Rounded Shoulders;Forward head   Posture Comments right scapular winging     AROM   Cervical Flexion 75   Cervical Extension 30   Cervical - Right Side Bend 30   Cervical - Left Side Bend 23   Cervical - Right Rotation 30   Cervical - Left Rotation 23   Lumbar Flexion 80   Lumbar Extension 25   Lumbar - Right Side Bend 35   Lumbar - Left Side Bend 35     Strength   Strength Assessment Site Lumbar   Right/Left Shoulder --  middle and lower trap strength 3+/5   Right/Left Hip Right;Left   Right Hip Flexion 3/5   Right Hip ABduction 4/5   Left Hip Flexion 3-/5   Left Hip ADduction 4-/5   Right/Left Knee Right;Left   Right Knee Flexion 4/5   Right Knee Extension 4/5   Left Knee Flexion 4+/5   Left Knee Extension 4+/5   Right/Left Ankle Right;Left   Right Ankle Dorsiflexion 2/5   Right Ankle Plantar Flexion 4/5   Right Ankle Inversion 4/5   Right Ankle Eversion 2/5   Left Ankle Dorsiflexion 4-/5   Left Ankle Plantar Flexion 4/5   Left Ankle Inversion 4/5   Left Ankle Eversion 4-/5   Cervical Flexion 4-/5   Cervical Extension 4-/5   Lumbar Flexion 3+/5   Lumbar Extension 3+/5     Palpation   Palpation comment multiple tender points      Special Tests   Cervical Tests Dictraction     Distraction Test   Findngs Negative     Slump test   Findings Positive   Side Right                           PT Education - 08/29/16 1209    Education provided Yes   Education Details abdominal  brace   Person(s) Educated Patient   Methods Explanation;Demonstration;Handout   Comprehension Verbalized understanding;Returned demonstration          PT Short Term Goals - 08/29/16 1318      PT  SHORT TERM GOAL #1   Title The patient will demonstrate knowledge  and compliance with initial HEP including low level transverse abdominal activation and postural correction ex  09/26/16   Time 4   Period Weeks   Status New     PT SHORT TERM GOAL #2   Title The patient will report a 25% improvement in lumbar pain and discomfort with walking   Time 4   Period Weeks   Status New     PT SHORT TERM GOAL #3   Title The patient will have improved cervical rotation and sidebending to 35 degrees bilaterally needed for driving   Time 4   Period Weeks   Status New     PT SHORT TERM GOAL #4   Title The patient will report a 25% improvement in neck/head and interscapular pain with usual ADLs and work duties   Time 4   Period Weeks   Status New     PT SHORT TERM GOAL #5   Title The patient will be able to walk 1 block as needed for traveling to [place] for mission trip   Time 4   Period Weeks   Status New           PT Long Term Goals - 08/29/16 1404      PT LONG TERM GOAL #1   Title The patient will be independent in safe self progression of HEP for further improvements in ROM and strength  10/24/16   Time 8   Period Weeks   Status New     PT LONG TERM GOAL #2   Title The patient will report a 45% improvement in neck and back pain with usual ADLS and work duties   Time 8   Period Weeks   Status New     PT LONG TERM GOAL #3   Title The patient will have improved deep cervical flexor, periscapular and lumbar core strength to grossly 4/5 needed for standing and walking longer periods of time with minimal pain exacerbation   Time 8   Period Weeks   Status New     PT LONG TERM GOAL #4   Title The patient will have improved FOTO functional outcome score to 56% to 43% indicating improved function with less pain   Time 8   Period Weeks   Status New     PT LONG TERM GOAL #5   Title The patient will be able to sit for 5-10 minutes with minimal increase in pain   Time 8   Period  Weeks   Status New     Additional Long Term Goals   Additional Long Term Goals Yes               Plan - 08/29/16 1254    Clinical Impression Statement +10 years ago neck and back pain with multiple injections, with the most recent nerve block L4-5 2 weeks ago;    neck pain bilateral radiates into base of head and between shoulder blades;  pain across low back and buttocks at present but has history of right  thigh and heel pain.  She has right foot drop and left "foot drag" per patient.  She has not worn AFOs.  She reports she had an episode of extreme cramping which resulted in patellar dislocations and bilateral meniscal injuries.  She has had a "handful of falls" as the result of her toes catching and her "quads not being strong enough to catch myself."  The patient is interested in pain control in order to increase activity level,  walk and be able to exercise  including a core strengthening program.  Limited cervical ROM in all planes especially rotation and sidebending.  Decreased muscle length in suboccipitals, upper traps.  Tender points in these muscles as well as rhomboids.  Painful lumbar ROM although minimal movement loss.  Poor transverse abdominal muscle activation, with overuse of gluteals.  Tender points in bilateral gluteals and piriformis.  Right > left ankle, knee and hip weakness.  The patient is of moderate complexity evaluation secondary to evolving status, multiple body regions and body systems affected and co-morbidities.     Rehab Potential Good   Clinical Impairments Affecting Rehab Potential Type 1 diabetes;  bilateral neuropathies; fall risk;  going to [place] 6/22 to 7/8   PT Frequency 2x / week   PT Duration 8 weeks   PT Treatment/Interventions ADLs/Self Care Home Management;Cryotherapy;Electrical Stimulation;Moist Heat;Traction;Ultrasound;Neuromuscular re-education;Therapeutic exercise;Therapeutic activities;Manual techniques;Dry needling;Taping   PT Next Visit Plan  DN of most painful body region (lumbar multifidi, bil gluteals, piriformis or cervical multifidi, upper traps, suboccipitals, rhomboids);  review abdominal brace and initiate deep flexor cervical activation;   foam roll melt method and decompression;  modalities for pain control    Consulted and Agree with Plan of Care Patient      Patient will benefit from skilled therapeutic intervention in order to improve the following deficits and impairments:  Pain, Decreased activity tolerance, Decreased range of motion, Difficulty walking, Decreased strength  Visit Diagnosis: Cervicalgia - Plan: PT plan of care cert/re-cert  Chronic bilateral low back pain, with sciatica presence unspecified - Plan: PT plan of care cert/re-cert  Muscle weakness (generalized) - Plan: PT plan of care cert/re-cert     Problem List Patient Active Problem List   Diagnosis Date Noted  . Aspirin allergy 05/23/2016  . Familial hyperlipidemia 05/23/2016  . Chest pain with moderate risk of acute coronary syndrome 05/23/2016  . Polyneuropathy in other diseases classified elsewhere ([place]) 01/06/2014  . Muscle cramps 11/11/2013  . Abnormality of gait 11/11/2013  . Type 1 diabetes mellitus with complications ([place]) 54/98/2641  . Essential hypertension 09/27/2008  . MITRAL VALVE PROLAPSE 09/27/2008  . MELANOSIS COLI 09/27/2008  . DEPRESSION, HX OF 09/27/2008   Ruben Im, PT 08/29/16 2:17 PM Phone: [phone] Fax: [phone]  Alvera Singh 08/29/2016, 2:16 PM  [facility] [address] [place], [place], [address] Phone: [phone]   Fax:  [phone]  Name: Dawn Lewis MRN: [id number] Date of Birth: 02/06/1956

## 2016-09-02 ENCOUNTER — Ambulatory Visit: Payer: BLUE CROSS/BLUE SHIELD

## 2016-09-02 DIAGNOSIS — M6281 Muscle weakness (generalized): Secondary | ICD-10-CM | POA: Diagnosis not present

## 2016-09-02 DIAGNOSIS — G8929 Other chronic pain: Secondary | ICD-10-CM | POA: Diagnosis not present

## 2016-09-02 DIAGNOSIS — M545 Low back pain: Secondary | ICD-10-CM | POA: Diagnosis not present

## 2016-09-02 DIAGNOSIS — M542 Cervicalgia: Secondary | ICD-10-CM

## 2016-09-02 NOTE — Therapy (Signed)
[facility] [address] [place], [place], [address] Phone: [phone]   Fax:  [phone]  Physical Therapy Treatment  Patient Details  Name: Dawn Lewis MRN: [id number] Date of Birth: Sep 30, 1955 Referring Provider: Dr. Melina Schools  Encounter Date: 09/02/2016      PT End of Session - 09/02/16 1447    Visit Number 2   Date for PT Re-Evaluation 10/24/16   PT Start Time 1400   PT Stop Time 1455   PT Time Calculation (min) 55 min   Activity Tolerance Patient tolerated treatment well   Behavior During Therapy [facility] for tasks assessed/performed      Past Medical History:  Diagnosis Date  . Abnormality of gait 11/11/2013  . Hiatal hernia   . Hypercholesterolemia   . Muscle cramps 11/11/2013  . Polyneuropathy in other diseases classified elsewhere ([place]) 01/06/2014  . Type 1 diabetes [facility])     Past Surgical History:  Procedure Laterality Date  . APPENDECTOMY    . CHOLECYSTECTOMY    . KNEE ARTHROSCOPY     left meniscus repair  . MANDIBLE SURGERY     extension  . PARTIAL HYSTERECTOMY    . tail bone repair     Coccyx fracture, subsequent resection  . TONSILLECTOMY      There were no vitals filed for this visit.      Subjective Assessment - 09/02/16 1405    Subjective Ready to try dry needling.  Wants to start with neck.     Currently in Pain? Yes   Pain Score 8    Pain Location Neck   Pain Orientation Left;Right   Pain Type Chronic pain   Pain Onset More than a month ago   Pain Frequency Constant   Aggravating Factors  turning head, looking down or leaning forward   Pain Relieving Factors rest, ice   Pain Score 5   Pain Location Back   Pain Orientation Right;Left;Lower   Pain Descriptors / Indicators Aching;Burning   Pain Type Chronic pain   Pain Onset More than a month ago   Pain Frequency Constant   Aggravating Factors  sitting   Pain Relieving Factors not sitting                          [facility] Adult PT Treatment/Exercise - 09/02/16 0001      Exercises   Exercises Neck     Modalities   Modalities Moist Heat     Moist Heat Therapy   Number Minutes Moist Heat 10 Minutes   Moist Heat Location Cervical     Manual Therapy   Manual Therapy Soft tissue mobilization;Myofascial release   Manual therapy comments soft tissue elongation to bil neck, upper trap and suboccipitals with passive stretch     Neck Exercises: Stretches   Other Neck Stretches cervical A/ROM 3 ways 3x20 seconds          Trigger Point Dry Needling - 09/02/16 1440    Consent Given? Yes   Education Handout Provided Yes   Muscles Treated Upper Body Upper trapezius;Oblique capitus;Suboccipitals muscle group  cervical multifidi   Upper Trapezius Response Twitch reponse elicited;Palpable increased muscle length   Oblique Capitus Response Twitch response elicited;Palpable increased muscle length   SubOccipitals Response Twitch response elicited;Palpable increased muscle length              PT Education - 09/02/16 1443    Education provided Yes  PT Short Term Goals - 09/02/16 1449      PT SHORT TERM GOAL #1   Title The patient will demonstrate knowledge and compliance with initial HEP including low level transverse abdominal activation and postural correction ex  09/26/16   Time 4   Period Weeks   Status On-going     PT SHORT TERM GOAL #2   Title The patient will report a 25% improvement in lumbar pain and discomfort with walking   Time 4   Period Weeks   Status On-going           PT Long Term Goals - 08/29/16 1404      PT LONG TERM GOAL #1   Title The patient will be independent in safe self progression of HEP for further improvements in ROM and strength  10/24/16   Time 8   Period Weeks   Status New     PT LONG TERM GOAL #2   Title The patient will report a 45% improvement in neck and back pain with usual ADLS and work duties    Time 8   Period Weeks   Status New     PT LONG TERM GOAL #3   Title The patient will have improved deep cervical flexor, periscapular and lumbar core strength to grossly 4/5 needed for standing and walking longer periods of time with minimal pain exacerbation   Time 8   Period Weeks   Status New     PT LONG TERM GOAL #4   Title The patient will have improved FOTO functional outcome score to 56% to 43% indicating improved function with less pain   Time 8   Period Weeks   Status New     PT LONG TERM GOAL #5   Title The patient will be able to sit for 5-10 minutes with minimal increase in pain   Time 8   Period Weeks   Status New     Additional Long Term Goals   Additional Long Term Goals Yes               Plan - 09/02/16 1450    Clinical Impression Statement Pt with only 1 session after evaluation.  Pt with tension and trigger points in bil upper traps and cervical musculature.  Pt with improved mobility and reduced pain after dry needling today. PT initiated cervical A/ROM for HEP today.  Pt will continue to benefit from skilled PT for strength, flexibility and dry needling.   Rehab Potential Good   Clinical Impairments Affecting Rehab Potential Type 1 diabetes;  bilateral neuropathies; fall risk;  going to [place] 6/22 to 7/8   PT Frequency 2x / week   PT Duration 8 weeks   PT Treatment/Interventions ADLs/Self Care Home Management;Cryotherapy;Electrical Stimulation;Moist Heat;Traction;Ultrasound;Neuromuscular re-education;Therapeutic exercise;Therapeutic activities;Manual techniques;Dry needling;Taping   PT Next Visit Plan assess response to dry needling to neck.  Dry needling to lumbar spine next.  Cervical ROM, melt method, core strength.    Consulted and Agree with Plan of Care Patient      Patient will benefit from skilled therapeutic intervention in order to improve the following deficits and impairments:  Pain, Decreased activity tolerance, Decreased range of  motion, Difficulty walking, Decreased strength  Visit Diagnosis: Cervicalgia  Chronic bilateral low back pain, with sciatica presence unspecified  Muscle weakness (generalized)     Problem List Patient Active Problem List   Diagnosis Date Noted  . Aspirin allergy 05/23/2016  . Familial hyperlipidemia 05/23/2016  . Chest  pain with moderate risk of acute coronary syndrome 05/23/2016  . Polyneuropathy in other diseases classified elsewhere ([place]) 01/06/2014  . Muscle cramps 11/11/2013  . Abnormality of gait 11/11/2013  . Type 1 diabetes mellitus with complications ([place]) 44/61/9012  . Essential hypertension 09/27/2008  . MITRAL VALVE PROLAPSE 09/27/2008  . MELANOSIS COLI 09/27/2008  . DEPRESSION, HX OF 09/27/2008     Sigurd Sos, PT 09/02/16 2:53 PM  [facility] [address] [place], [place], [address] Phone: [phone]   Fax:  [phone]  Name: Dawn Lewis MRN: [id number] Date of Birth: 1955/10/19

## 2016-09-02 NOTE — Patient Instructions (Addendum)
Trigger Point Dry Needling  . What is Trigger Point Dry Needling (DN)? o DN is a physical therapy technique used to treat muscle pain and dysfunction. Specifically, DN helps deactivate muscle trigger points (muscle knots).  o A thin filiform needle is used to penetrate the skin and stimulate the underlying trigger point. The goal is for a local twitch response (LTR) to occur and for the trigger point to relax. No medication of any kind is injected during the procedure.   . What Does Trigger Point Dry Needling Feel Like?  o The procedure feels different for each individual patient. Some patients report that they do not actually feel the needle enter the skin and overall the process is not painful. Very mild bleeding may occur. However, many patients feel a deep cramping in the muscle in which the needle was inserted. This is the local twitch response.   Marland Kitchen How Will I feel after the treatment? o Soreness is normal, and the onset of soreness may not occur for a few hours. Typically this soreness does not last longer than two days.  o Bruising is uncommon, however; ice can be used to decrease any possible bruising.  o In rare cases feeling tired or nauseous after the treatment is normal. In addition, your symptoms may get worse before they get better, this period will typically not last longer than 24 hours.   . What Can I do After My Treatment? o Increase your hydration by drinking more water for the next 24 hours. o You may place ice or heat on the areas treated that have become sore, however, do not use heat on inflamed or bruised areas. Heat often brings more relief post needling. o You can continue your regular activities, but vigorous activity is not recommended initially after the treatment for 24 hours. o DN is best combined with other physical therapy such as strengthening, stretching, and other therapies.   PERFORM ALL EXERCISES GENTLY AND WITH GOOD POSTURE.    20 SECOND HOLD, 3 REPS TO  EACH SIDE. 4-5 TIMES EACH DAY.   AROM: Neck Rotation   Turn head slowly to look over one shoulder, then the other.   AROM: Neck Flexion   Bend head forward.   AROM: Lateral Neck Flexion   Slowly tilt head toward one shoulder, then the other.   [place] [place] [address], [place], [place] [address] Phone # [phone] Fax [phone]

## 2016-09-04 ENCOUNTER — Ambulatory Visit: Payer: BLUE CROSS/BLUE SHIELD

## 2016-09-04 DIAGNOSIS — M542 Cervicalgia: Secondary | ICD-10-CM | POA: Diagnosis not present

## 2016-09-04 DIAGNOSIS — M6281 Muscle weakness (generalized): Secondary | ICD-10-CM | POA: Diagnosis not present

## 2016-09-04 DIAGNOSIS — M545 Low back pain: Principal | ICD-10-CM

## 2016-09-04 DIAGNOSIS — G8929 Other chronic pain: Secondary | ICD-10-CM

## 2016-09-04 DIAGNOSIS — K9041 Non-celiac gluten sensitivity: Secondary | ICD-10-CM | POA: Diagnosis not present

## 2016-09-04 NOTE — Patient Instructions (Addendum)
HIP: Hamstrings - Short Sitting    Rest leg on raised surface. Keep knee straight. Lift chest. Hold _20__ seconds. _3__ reps per set, _3__ sets per day Copyright  VHI. All rights reserved.    Hip Flexor Stretch    Lying on back near edge of bed, bend one leg, foot flat. Hang other leg over edge, relaxed, thigh resting.  Hold 20 seconds, 3 reps.      BACK: Hip Flexor Stretch    Hold 20 seconds and do 3.     Copyright  VHI. All rights reserved.  [place] [address], [place], [place] [address] Phone # [phone] Fax [phone]

## 2016-09-04 NOTE — Therapy (Signed)
[facility] [address] [place], [place], [address] Phone: [phone]   Fax:  [phone]  Physical Therapy Treatment  Patient Details  Name: Dawn Lewis MRN: [id number] Date of Birth: [birth year] Referring Provider: Dr. Melina Schools  Encounter Date: 09/04/2016      Dawn Lewis End of Session - 09/04/16 1445    Visit Number 3   Date for Dawn Lewis Re-Evaluation 10/24/16   Dawn Lewis Start Time 1359   Dawn Lewis Stop Time 1455   Dawn Lewis Time Calculation (min) 56 min   Activity Tolerance Patient tolerated treatment well   Behavior During Therapy [facility] for tasks assessed/performed      Past Medical History:  Diagnosis Date  . Abnormality of gait 11/11/2013  . Hiatal hernia   . Hypercholesterolemia   . Muscle cramps 11/11/2013  . Polyneuropathy in other diseases classified elsewhere ([place]) 01/06/2014  . Type 1 diabetes [facility])     Past Surgical History:  Procedure Laterality Date  . APPENDECTOMY    . CHOLECYSTECTOMY    . KNEE ARTHROSCOPY     left meniscus repair  . MANDIBLE SURGERY     extension  . PARTIAL HYSTERECTOMY    . tail bone repair     Coccyx fracture, subsequent resection  . TONSILLECTOMY      There were no vitals filed for this visit.      Subjective Assessment - 09/04/16 1400    Subjective I was very sore after dry needling to the neck.  I was active after I left here and then had difficulty with falling asleep.     Currently in Pain? Yes   Pain Score 7    Pain Location Neck   Pain Orientation Left;Right   Pain Descriptors / Indicators Tightness;Aching   Pain Type Chronic pain   Pain Onset More than a month ago   Pain Frequency Constant   Aggravating Factors  turning head, looking down or leaning forward   Pain Relieving Factors rest, ice   Pain Score 6   Pain Location Back   Pain Orientation Right;Left;Lower   Pain Descriptors / Indicators Aching;Burning                         [facility] Adult Dawn Lewis  Treatment/Exercise - 09/04/16 0001      Exercises   Exercises Neck;Lumbar;Knee/Hip     Lumbar Exercises: Stretches   Active Hamstring Stretch 3 reps;20 seconds   Single Knee to Chest Stretch 3 reps;20 seconds   Lower Trunk Rotation 3 reps;20 seconds     Lumbar Exercises: Supine   Ab Set 10 reps     Knee/Hip Exercises: Aerobic   Nustep Level 4 x 6 minutes  Dawn Lewis present to discuss progress     Moist Heat Therapy   Number Minutes Moist Heat 10 Minutes   Moist Heat Location Lumbar Spine     Manual Therapy   Manual Therapy Soft tissue mobilization;Myofascial release   Manual therapy comments soft tissue elongation to bil lumbar paraspinals and gluteals          Trigger Point Dry Needling - 09/04/16 1404    Consent Given? Yes   Muscles Treated Upper Body --  lumbar paraspinals and multifidi   Muscles Treated Lower Body Gluteus minimus;Gluteus maximus;Piriformis   Gluteus Maximus Response Twitch response elicited;Palpable increased muscle length   Gluteus Minimus Response Twitch response elicited;Palpable increased muscle length   Piriformis Response Twitch response elicited;Palpable increased muscle length  Dawn Lewis Education - 09/04/16 1415    Education provided Yes   Education Details hip flexibility   Person(s) Educated Patient   Methods Explanation;Demonstration;Handout   Comprehension Verbalized understanding;Returned demonstration          Dawn Lewis Short Term Goals - 09/02/16 1449      Dawn Lewis SHORT TERM GOAL #1   Title The patient will demonstrate knowledge and compliance with initial HEP including low level transverse abdominal activation and postural correction ex  09/26/16   Time 4   Period Weeks   Status On-going     Dawn Lewis SHORT TERM GOAL #2   Title The patient will report a 25% improvement in lumbar pain and discomfort with walking   Time 4   Period Weeks   Status On-going           Dawn Lewis Long Term Goals - 08/29/16 1404      Dawn Lewis LONG TERM GOAL #1    Title The patient will be independent in safe self progression of HEP for further improvements in ROM and strength  10/24/16   Time 8   Period Weeks   Status New     Dawn Lewis LONG TERM GOAL #2   Title The patient will report a 45% improvement in neck and back pain with usual ADLS and work duties   Time 8   Period Weeks   Status New     Dawn Lewis LONG TERM GOAL #3   Title The patient will have improved deep cervical flexor, periscapular and lumbar core strength to grossly 4/5 needed for standing and walking longer periods of time with minimal pain exacerbation   Time 8   Period Weeks   Status New     Dawn Lewis LONG TERM GOAL #4   Title The patient will have improved FOTO functional outcome score to 56% to 43% indicating improved function with less pain   Time 8   Period Weeks   Status New     Dawn Lewis LONG TERM GOAL #5   Title The patient will be able to sit for 5-10 minutes with minimal increase in pain   Time 8   Period Weeks   Status New     Additional Long Term Goals   Additional Long Term Goals Yes               Plan - 09/04/16 1416    Clinical Impression Statement Dawn Lewis with muscle soreness after dry needling to neck and not able to determine if needling helped.  Dawn Lewis initiated HEP for hip flexibility and reviewed core strength.  Dawn Lewis will continue with flexibility and strength.  Dawn Lewis with increased tension in bil lumbar paraspinals and gluteals and demonstrated improved tissue mobility after treatment today.  Dawn Lewis will continue to benefit from skilled Dawn Lewis for flexibility, strength and manual/modalities as needed.     Rehab Potential Good   Clinical Impairments Affecting Rehab Potential Type 1 diabetes;  bilateral neuropathies; fall risk;  going to [place] 6/22 to 7/8   Dawn Lewis Frequency 2x / week   Dawn Lewis Duration 8 weeks   Dawn Lewis Treatment/Interventions ADLs/Self Care Home Management;Cryotherapy;Electrical Stimulation;Moist Heat;Traction;Ultrasound;Neuromuscular re-education;Therapeutic exercise;Therapeutic  activities;Manual techniques;Dry needling;Taping   Dawn Lewis Next Visit Plan assess response to dry needling to low back and gluteals  Dry needling to cervical spine next.  Cervical ROM, melt method, core strength. Review lumbar flexibility   Consulted and Agree with Plan of Care Patient      Patient will benefit from skilled therapeutic intervention in order to improve  the following deficits and impairments:  Pain, Decreased activity tolerance, Decreased range of motion, Difficulty walking, Decreased strength  Visit Diagnosis: Chronic bilateral low back pain, with sciatica presence unspecified  Muscle weakness (generalized)     Problem List Patient Active Problem List   Diagnosis Date Noted  . Aspirin allergy 05/23/2016  . Familial hyperlipidemia 05/23/2016  . Chest pain with moderate risk of acute coronary syndrome 05/23/2016  . Polyneuropathy in other diseases classified elsewhere ([place]) 01/06/2014  . Muscle cramps 11/11/2013  . Abnormality of gait 11/11/2013  . Type 1 diabetes mellitus with complications ([place]) 85/88/5027  . Essential hypertension 09/27/2008  . MITRAL VALVE PROLAPSE 09/27/2008  . MELANOSIS COLI 09/27/2008  . DEPRESSION, HX OF 09/27/2008     Dawn Lewis, Dawn Lewis 09/04/16 2:48 PM  [facility] [address] [place], [place], [address] Phone: [phone]   Fax:  [phone]  Name: Dawn Lewis MRN: [id number] Date of Birth: [birth year]

## 2016-09-06 DIAGNOSIS — E119 Type 2 diabetes mellitus without complications: Secondary | ICD-10-CM | POA: Diagnosis not present

## 2016-09-06 DIAGNOSIS — H40053 Ocular hypertension, bilateral: Secondary | ICD-10-CM | POA: Diagnosis not present

## 2016-09-11 DIAGNOSIS — K9041 Non-celiac gluten sensitivity: Secondary | ICD-10-CM | POA: Diagnosis not present

## 2016-09-12 ENCOUNTER — Ambulatory Visit: Payer: BLUE CROSS/BLUE SHIELD | Admitting: Physical Therapy

## 2016-09-12 DIAGNOSIS — M542 Cervicalgia: Secondary | ICD-10-CM

## 2016-09-12 DIAGNOSIS — M6281 Muscle weakness (generalized): Secondary | ICD-10-CM

## 2016-09-12 DIAGNOSIS — G8929 Other chronic pain: Secondary | ICD-10-CM

## 2016-09-12 DIAGNOSIS — M545 Low back pain: Principal | ICD-10-CM

## 2016-09-12 NOTE — Therapy (Signed)
[facility] [address] [place], [place], [address] Phone: [phone]   Fax:  [phone]  Physical Therapy Treatment  Patient Details  Name: Dawn Lewis MRN: [id number] Date of Birth: May 14, 1955 Referring Provider: Dr. Melina Schools  Encounter Date: 09/12/2016      PT End of Session - 09/12/16 0927    Visit Number 4   Date for PT Re-Evaluation 10/24/16   PT Start Time 0853   PT Stop Time 0943   PT Time Calculation (min) 50 min   Activity Tolerance Patient tolerated treatment well      Past Medical History:  Diagnosis Date  . Abnormality of gait 11/11/2013  . Hiatal hernia   . Hypercholesterolemia   . Muscle cramps 11/11/2013  . Polyneuropathy in other diseases classified elsewhere ([place]) 01/06/2014  . Type 1 diabetes [facility])     Past Surgical History:  Procedure Laterality Date  . APPENDECTOMY    . CHOLECYSTECTOMY    . KNEE ARTHROSCOPY     left meniscus repair  . MANDIBLE SURGERY     extension  . PARTIAL HYSTERECTOMY    . tail bone repair     Coccyx fracture, subsequent resection  . TONSILLECTOMY      There were no vitals filed for this visit.      Subjective Assessment - 09/12/16 0853    Subjective I like the dry needling but my neck got pretty sore but I also lifted boxes at work.  The back was not too sore.  My left neck catches some.  I've haven't been doing a lot of exercises b/c of work.   I had a massage on Monday and she could tell I was not as tight.     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 Yes   Pain Score 6    Pain Location Back   Pain Type Chronic pain   Pain Score 7   Pain Location Neck   Pain Type Chronic pain   Pain Frequency Constant                         [facility] Adult PT Treatment/Exercise - 09/12/16 0001      Neck Exercises: Seated   Cervical Isometrics Flexion;Extension;Right lateral  flexion;Left lateral flexion;Right rotation;Left rotation;5 secs;5 reps   Other Seated Exercise review of previous HEP     Moist Heat Therapy   Number Minutes Moist Heat 10 Minutes   Moist Heat Location Cervical     Manual Therapy   Manual Therapy Joint mobilization;Taping   Manual therapy comments soft tissue elongation to bil neck, upper trap and suboccipitals with passive stretch   Joint Mobilization grade 2 PA C7-T1 5x   Kinesiotex Inhibit Muscle     Kinesiotix   Inhibit Muscle  Star formation lumbar          Trigger Point Dry Needling - 09/12/16 0947    Consent Given? Yes   Muscles Treated Upper Body --  bil cervical multifidi   Upper Trapezius Response Twitch reponse elicited;Palpable increased muscle length   Oblique Capitus Response Palpable increased muscle length   SubOccipitals Response Twitch response elicited;Palpable increased muscle length       DN performed bilaterally       PT Education - 09/12/16 0926    Education provided Yes   Education Details neck isometrics   Person(s) Educated Patient   Methods  Explanation;Demonstration;Handout   Comprehension Verbalized understanding;Returned demonstration          PT Short Term Goals - 09/12/16 0956      PT SHORT TERM GOAL #1   Title The patient will demonstrate knowledge and compliance with initial HEP including low level transverse abdominal activation and postural correction ex  09/26/16   Time 4   Period Weeks   Status On-going     PT SHORT TERM GOAL #2   Title The patient will report a 25% improvement in lumbar pain and discomfort with walking   Time 4   Period Weeks   Status On-going     PT SHORT TERM GOAL #3   Title The patient will have improved cervical rotation and sidebending to 35 degrees bilaterally needed for driving   Time 4   Period Weeks   Status On-going     PT SHORT TERM GOAL #4   Title The patient will report a 25% improvement in neck/head and interscapular pain with  usual ADLs and work duties   Time 4   Period Weeks   Status On-going     PT SHORT TERM GOAL #5   Title The patient will be able to walk 1 block as needed for traveling to [place] for mission trip   Time 4   Period Weeks   Status On-going           PT Long Term Goals - 09/12/16 0957      PT LONG TERM GOAL #1   Title The patient will be independent in safe self progression of HEP for further improvements in ROM and strength  10/24/16   Time 8   Period Weeks   Status On-going     PT LONG TERM GOAL #2   Title The patient will report a 45% improvement in neck and back pain with usual ADLS and work duties   Time 8   Period Weeks   Status On-going     PT LONG TERM GOAL #3   Title The patient will have improved deep cervical flexor, periscapular and lumbar core strength to grossly 4/5 needed for standing and walking longer periods of time with minimal pain exacerbation   Time 8   Period Weeks   Status On-going     PT LONG TERM GOAL #4   Title The patient will have improved FOTO functional outcome score to 56% to 43% indicating improved function with less pain   Time 8   Period Weeks   Status On-going     PT LONG TERM GOAL #5   Title The patient will be able to sit for 5-10 minutes with minimal increase in pain   Time 8   Period Weeks   Status On-going               Plan - 09/12/16 1829    Clinical Impression Statement The patient feels like dry needling has been helpful overall despite typical post-needling soreness.  She reports she has been doing a lot of lifting, unpacking boxes and going up and down the stairs for hours at a time which may account for some of her increase in pain.  Encouraged use of her home TENs for pain control.   She is receptive to cervical isometric exercises and additonal core strengthening and scapular stabilization would be beneficial although she will need a slower progression b/c of pain chronicity.     Clinical Impairments Affecting  Rehab Potential Type 1 diabetes;  bilateral  neuropathies; fall risk;  going to [place] 6/22 to 7/8   PT Treatment/Interventions ADLs/Self Care Home Management;Cryotherapy;Electrical Stimulation;Moist Heat;Traction;Ultrasound;Neuromuscular re-education;Therapeutic exercise;Therapeutic activities;Manual techniques;Dry needling;Taping   PT Next Visit Plan assess response to dry needling to cervical region this visit and perform DN to low back and gluteals next; taping if helpful; Cervical ROM, melt method, core strength. Review lumbar flexibility;  recheck ROM and progress toward STGs      Patient will benefit from skilled therapeutic intervention in order to improve the following deficits and impairments:  Pain, Decreased activity tolerance, Decreased range of motion, Difficulty walking, Decreased strength  Visit Diagnosis: Chronic bilateral low back pain, with sciatica presence unspecified  Muscle weakness (generalized)  Cervicalgia     Problem List Patient Active Problem List   Diagnosis Date Noted  . Aspirin allergy 05/23/2016  . Familial hyperlipidemia 05/23/2016  . Chest pain with moderate risk of acute coronary syndrome 05/23/2016  . Polyneuropathy in other diseases classified elsewhere ([place]) 01/06/2014  . Muscle cramps 11/11/2013  . Abnormality of gait 11/11/2013  . Type 1 diabetes mellitus with complications ([place]) 16/01/9603  . Essential hypertension 09/27/2008  . MITRAL VALVE PROLAPSE 09/27/2008  . MELANOSIS COLI 09/27/2008  . DEPRESSION, HX OF 09/27/2008   Ruben Im, PT 09/12/16 9:59 AM Phone: [phone] Fax: [phone]  Alvera Singh 09/12/2016, 9:58 AM  [facility] [address], [place], [place], [address] Phone: [phone]   Fax:  [phone]  Name: Dawn Lewis MRN: [id number] Date of Birth: 22-Sep-1955

## 2016-09-12 NOTE — Patient Instructions (Signed)
  Isometric Rotation   Put left index finger on left temple. Gently try to turn head to right, pushing against finger. Hold __5__ seconds. Repeat on the left. Push and release slowly. Repeat __5__ times. Do __2__ sessions per day.  http://gt2.exer.us/24   Copyright  VHI. All rights reserved.  Isometric Lateral Flexion   Put right index finger on right temple. Gently try to move right ear toward shoulder, pushing against finger. Hold __5__ seconds. Repeat on other side. Push and release slowly. Repeat __5__ times. Do __2__ sessions per day.  http://gt2.exer.us/22   Copyright  VHI. All rights reserved.  Isometric Flexion   Put the tips of both index fingers lightly on forehead. Gently press into fingers as if looking toward ground. Resist for ___5_ seconds. Press and release slowly. Repeat __5__ times. Do __2__ sessions per day.  http://gt2.exer.us/18   Copyright  VHI. All rights reserved.  Isometric Extension   Put index fingers gently on back of head. Slowly try to look toward ceiling. Push head into fingers for 5___ seconds. Push and release slowly. Repeat ___5_ times. Do ___2_ sessions per day.  http://gt2.exer.us/20   Copyright  VHI. All rights reserved.     Ruben Im PT [facility] [address], [place],  [address] Phone # [phone] Fax [phone]

## 2016-09-16 ENCOUNTER — Ambulatory Visit: Payer: BLUE CROSS/BLUE SHIELD | Attending: Orthopedic Surgery

## 2016-09-16 DIAGNOSIS — M545 Low back pain: Secondary | ICD-10-CM | POA: Diagnosis not present

## 2016-09-16 DIAGNOSIS — G8929 Other chronic pain: Secondary | ICD-10-CM | POA: Diagnosis not present

## 2016-09-16 DIAGNOSIS — M542 Cervicalgia: Secondary | ICD-10-CM | POA: Insufficient documentation

## 2016-09-16 DIAGNOSIS — M6281 Muscle weakness (generalized): Secondary | ICD-10-CM

## 2016-09-16 NOTE — Therapy (Signed)
[facility] [address], [place], [place], [address] Phone: [phone]   Fax:  [phone]  Physical Therapy Treatment  Patient Details  Name: Dawn Lewis MRN: [id number] Date of Birth: 01-11-1956 Referring Provider: Dr. Melina Schools  Encounter Date: 09/16/2016      PT End of Session - 09/16/16 1448    Visit Number 5   Date for PT Re-Evaluation 10/24/16   PT Start Time 1400   PT Stop Time 1500   PT Time Calculation (min) 60 min   Activity Tolerance Patient tolerated treatment well   Behavior During Therapy [facility] for tasks assessed/performed      Past Medical History:  Diagnosis Date  . Abnormality of gait 11/11/2013  . Hiatal hernia   . Hypercholesterolemia   . Muscle cramps 11/11/2013  . Polyneuropathy in other diseases classified elsewhere ([place]) 01/06/2014  . Type 1 diabetes [facility])     Past Surgical History:  Procedure Laterality Date  . APPENDECTOMY    . CHOLECYSTECTOMY    . KNEE ARTHROSCOPY     left meniscus repair  . MANDIBLE SURGERY     extension  . PARTIAL HYSTERECTOMY    . tail bone repair     Coccyx fracture, subsequent resection  . TONSILLECTOMY      There were no vitals filed for this visit.      Subjective Assessment - 09/16/16 1358    Subjective Dry needling last session to neck with a little bit of improvement.     Currently in Pain? Yes   Pain Score 7    Pain Location Back   Pain Orientation Right;Left   Pain Descriptors / Indicators Aching;Tightness   Pain Type Chronic pain   Pain Onset More than a month ago   Pain Frequency Constant   Aggravating Factors  sitting   Pain Relieving Factors change of position   Pain Score 5   Pain Location Neck   Pain Orientation Right;Left   Pain Descriptors / Indicators Aching;Shooting   Pain Type Chronic pain   Pain Onset More than a month ago   Pain Frequency Constant   Aggravating Factors  moving   Pain Relieving Factors finding  a comfortable position.              [facility] PT Assessment - 09/16/16 0001      AROM   Cervical - Right Rotation 55   Cervical - Left Rotation 55                     [facility] Adult PT Treatment/Exercise - 09/16/16 0001      Knee/Hip Exercises: Aerobic   Nustep Level 4 x 5 minutes  PT present to discuss progress     Moist Heat Therapy   Number Minutes Moist Heat 10 Minutes   Moist Heat Location Lumbar Spine     Manual Therapy   Manual Therapy Soft tissue mobilization;Myofascial release   Manual therapy comments soft tissue elongation to bil lumbar paraspinals and gluteals   Kinesiotex Inhibit Muscle     Kinesiotix   Inhibit Muscle  Star formation lumbar     Neck Exercises: Stretches   Other Neck Stretches supine Melt method using foam roll          Trigger Point Dry Needling - 09/16/16 1416    Consent Given? Yes   Muscles Treated Upper Body --  lumbar multifidi   Muscles Treated Lower Body Gluteus maximus;Gluteus minimus;Piriformis  Gluteus Maximus Response Twitch response elicited;Palpable increased muscle length   Gluteus Minimus Response Twitch response elicited;Palpable increased muscle length   Piriformis Response Twitch response elicited;Palpable increased muscle length                PT Short Term Goals - 09/16/16 1404      PT SHORT TERM GOAL #1   Title The patient will demonstrate knowledge and compliance with initial HEP including low level transverse abdominal activation and postural correction ex  09/26/16   Time 4   Period Weeks   Status On-going     PT SHORT TERM GOAL #2   Title The patient will report a 25% improvement in lumbar pain and discomfort with walking     PT SHORT TERM GOAL #4   Title The patient will report a 25% improvement in neck/head and interscapular pain with usual ADLs and work duties   Baseline 15-20% better   Time 4   Period Weeks   Status On-going     PT SHORT TERM GOAL #5   Title The patient will  be able to walk 1 block as needed for traveling to [place] for mission trip   Baseline legs weak-1/4 mile to 1/2 mile   Time 4   Period Weeks   Status On-going           PT Long Term Goals - 09/12/16 0957      PT LONG TERM GOAL #1   Title The patient will be independent in safe self progression of HEP for further improvements in ROM and strength  10/24/16   Time 8   Period Weeks   Status On-going     PT LONG TERM GOAL #2   Title The patient will report a 45% improvement in neck and back pain with usual ADLS and work duties   Time 8   Period Weeks   Status On-going     PT LONG TERM GOAL #3   Title The patient will have improved deep cervical flexor, periscapular and lumbar core strength to grossly 4/5 needed for standing and walking longer periods of time with minimal pain exacerbation   Time 8   Period Weeks   Status On-going     PT LONG TERM GOAL #4   Title The patient will have improved FOTO functional outcome score to 56% to 43% indicating improved function with less pain   Time 8   Period Weeks   Status On-going     PT LONG TERM GOAL #5   Title The patient will be able to sit for 5-10 minutes with minimal increase in pain   Time 8   Period Weeks   Status On-going               Plan - 09/16/16 1409    Clinical Impression Statement Pt reports 15-20% overall improvement since the start of care.  Pt is limited to walking 1/4 to 1/2 mile due to leg weakness.  Pt demonstrates 55 degrees bil cervical A/ROM and this is improved from evaluation.  Pt with continued chronic neck and low back pain that she rates 5-7/10.  Pt with tension in bil. neck and low back.  Dry needling to bil lubar and gluteals today and pt with active trigger points.  Pt with improved mobility and reduced pain after manual and needling today. Pt will continue to benefit from skilled PT for strength, flexibility and manual as needed to reduce pain.     Rehab  Potential Good   Clinical Impairments  Affecting Rehab Potential Type 1 diabetes;  bilateral neuropathies; fall risk;  going to [place] 6/22 to 7/8   PT Frequency 2x / week   PT Duration 8 weeks   PT Treatment/Interventions ADLs/Self Care Home Management;Cryotherapy;Electrical Stimulation;Moist Heat;Traction;Ultrasound;Neuromuscular re-education;Therapeutic exercise;Therapeutic activities;Manual techniques;Dry needling;Taping   PT Next Visit Plan assess response to dry needling to lumbar.  continue kinesiotaping if helpful.  Gentle mobility   Consulted and Agree with Plan of Care Patient      Patient will benefit from skilled therapeutic intervention in order to improve the following deficits and impairments:  Pain, Decreased activity tolerance, Decreased range of motion, Difficulty walking, Decreased strength  Visit Diagnosis: Chronic bilateral low back pain, with sciatica presence unspecified  Muscle weakness (generalized)  Cervicalgia     Problem List Patient Active Problem List   Diagnosis Date Noted  . Aspirin allergy 05/23/2016  . Familial hyperlipidemia 05/23/2016  . Chest pain with moderate risk of acute coronary syndrome 05/23/2016  . Polyneuropathy in other diseases classified elsewhere ([place]) 01/06/2014  . Muscle cramps 11/11/2013  . Abnormality of gait 11/11/2013  . Type 1 diabetes mellitus with complications ([place]) 00/51/1021  . Essential hypertension 09/27/2008  . MITRAL VALVE PROLAPSE 09/27/2008  . MELANOSIS COLI 09/27/2008  . DEPRESSION, HX OF 09/27/2008     Sigurd Sos, PT 09/16/16 2:50 PM  [facility] [address] [place], [place], [address] Phone: [phone]   Fax:  [phone]  Name: TARINA VOLK MRN: [id number] Date of Birth: [birth year]

## 2016-09-17 DIAGNOSIS — E109 Type 1 diabetes mellitus without complications: Secondary | ICD-10-CM | POA: Diagnosis not present

## 2016-09-18 ENCOUNTER — Ambulatory Visit: Payer: BLUE CROSS/BLUE SHIELD

## 2016-09-18 DIAGNOSIS — M545 Low back pain: Secondary | ICD-10-CM | POA: Diagnosis not present

## 2016-09-18 DIAGNOSIS — M542 Cervicalgia: Secondary | ICD-10-CM | POA: Diagnosis not present

## 2016-09-18 DIAGNOSIS — M6281 Muscle weakness (generalized): Secondary | ICD-10-CM | POA: Diagnosis not present

## 2016-09-18 DIAGNOSIS — G8929 Other chronic pain: Secondary | ICD-10-CM

## 2016-09-18 NOTE — Therapy (Signed)
[facility] [address] [place], [place], [address] Phone: [phone]   Fax:  [phone]  Physical Therapy Treatment  Patient Details  Name: Dawn Lewis MRN: [id number] Date of Birth: 1955-08-17 Referring Provider: Dr. Melina Schools  Encounter Date: 09/18/2016      PT End of Session - 09/18/16 1449    Visit Number 6   Date for PT Re-Evaluation 10/24/16   PT Start Time 1884   PT Stop Time 1500   PT Time Calculation (min) 58 min   Activity Tolerance Patient tolerated treatment well   Behavior During Therapy [facility] for tasks assessed/performed      Past Medical History:  Diagnosis Date  . Abnormality of gait 11/11/2013  . Hiatal hernia   . Hypercholesterolemia   . Muscle cramps 11/11/2013  . Polyneuropathy in other diseases classified elsewhere ([place]) 01/06/2014  . Type 1 diabetes [facility])     Past Surgical History:  Procedure Laterality Date  . APPENDECTOMY    . CHOLECYSTECTOMY    . KNEE ARTHROSCOPY     left meniscus repair  . MANDIBLE SURGERY     extension  . PARTIAL HYSTERECTOMY    . tail bone repair     Coccyx fracture, subsequent resection  . TONSILLECTOMY      There were no vitals filed for this visit.      Subjective Assessment - 09/18/16 1402    Subjective Low back feels a little better after dry needling.     Currently in Pain? Yes   Pain Score 4    Pain Location Back   Pain Score 5   Pain Location Neck   Pain Orientation Right;Left                         [facility] Adult PT Treatment/Exercise - 09/18/16 0001      Neck Exercises: Machines for Strengthening   UBE (Upper Arm Bike) Level 1 x 6 minutes (3/3)     Neck Exercises: Supine   Other Supine Exercise supine on foam roll for decompression: yellow theraband horizontal abduction and overhead flexion 2x10     Moist Heat Therapy   Number Minutes Moist Heat 10 Minutes   Moist Heat Location Cervical;Lumbar Spine     Manual Therapy   Manual Therapy Joint mobilization;Taping   Manual therapy comments soft tissue elongation to bil neck, upper trap and suboccipitals with passive stretch   Joint Mobilization grade 2 PA C7-T1 5x   Kinesiotex Inhibit Muscle     Kinesiotix   Inhibit Muscle  I strips over upper traps          Trigger Point Dry Needling - 09/18/16 1415    Consent Given? Yes   Muscles Treated Upper Body Upper trapezius;Oblique capitus;Suboccipitals muscle group   Upper Trapezius Response Twitch reponse elicited;Palpable increased muscle length   Oblique Capitus Response Twitch response elicited;Palpable increased muscle length   SubOccipitals Response Twitch response elicited;Palpable increased muscle length                PT Short Term Goals - 09/16/16 1404      PT SHORT TERM GOAL #1   Title The patient will demonstrate knowledge and compliance with initial HEP including low level transverse abdominal activation and postural correction ex  09/26/16   Time 4   Period Weeks   Status On-going     PT SHORT TERM GOAL #2   Title The patient will  report a 25% improvement in lumbar pain and discomfort with walking     PT SHORT TERM GOAL #4   Title The patient will report a 25% improvement in neck/head and interscapular pain with usual ADLs and work duties   Baseline 15-20% better   Time 4   Period Weeks   Status On-going     PT SHORT TERM GOAL #5   Title The patient will be able to walk 1 block as needed for traveling to [place] for mission trip   Baseline legs weak-1/4 mile to 1/2 mile   Time 4   Period Weeks   Status On-going           PT Long Term Goals - 09/12/16 0957      PT LONG TERM GOAL #1   Title The patient will be independent in safe self progression of HEP for further improvements in ROM and strength  10/24/16   Time 8   Period Weeks   Status On-going     PT LONG TERM GOAL #2   Title The patient will report a 45% improvement in neck and back pain with  usual ADLS and work duties   Time 8   Period Weeks   Status On-going     PT LONG TERM GOAL #3   Title The patient will have improved deep cervical flexor, periscapular and lumbar core strength to grossly 4/5 needed for standing and walking longer periods of time with minimal pain exacerbation   Time 8   Period Weeks   Status On-going     PT LONG TERM GOAL #4   Title The patient will have improved FOTO functional outcome score to 56% to 43% indicating improved function with less pain   Time 8   Period Weeks   Status On-going     PT LONG TERM GOAL #5   Title The patient will be able to sit for 5-10 minutes with minimal increase in pain   Time 8   Period Weeks   Status On-going               Plan - 09/18/16 1409    Clinical Impression Statement Pt reports 15-20% overall improvement in symptoms since the start of care.  Pt with improved cervical A/ROM since the start of care.  Pt with continued chronic neck and low back pain that she rates 4-7/10.  Pt with tension in bil neck and low back.  Dry needling to neck and upper traps today and pt demonstrated improved tissue mobility overall and reduced pain.  Pt will continue to benfit from skilled PT for strength, flexibility and manual as needed to reduce pain.   Rehab Potential Good   Clinical Impairments Affecting Rehab Potential Type 1 diabetes;  bilateral neuropathies; fall risk;  going to [place] 6/22 to 7/8   PT Frequency 2x / week   PT Duration 8 weeks   PT Treatment/Interventions ADLs/Self Care Home Management;Cryotherapy;Electrical Stimulation;Moist Heat;Traction;Ultrasound;Neuromuscular re-education;Therapeutic exercise;Therapeutic activities;Manual techniques;Dry needling;Taping   PT Next Visit Plan assess response to dry needling to lumbar.  continue kinesiotaping if helpful.  Gentle mobility.  Add supine theraband to HEP if tolerated well.     Consulted and Agree with Plan of Care Patient      Patient will benefit from  skilled therapeutic intervention in order to improve the following deficits and impairments:  Pain, Decreased activity tolerance, Decreased range of motion, Difficulty walking, Decreased strength  Visit Diagnosis: Chronic bilateral low back pain, with sciatica  presence unspecified  Muscle weakness (generalized)  Cervicalgia     Problem List Patient Active Problem List   Diagnosis Date Noted  . Aspirin allergy 05/23/2016  . Familial hyperlipidemia 05/23/2016  . Chest pain with moderate risk of acute coronary syndrome 05/23/2016  . Polyneuropathy in other diseases classified elsewhere ([place]) 01/06/2014  . Muscle cramps 11/11/2013  . Abnormality of gait 11/11/2013  . Type 1 diabetes mellitus with complications ([place]) 18/59/0931  . Essential hypertension 09/27/2008  . MITRAL VALVE PROLAPSE 09/27/2008  . MELANOSIS COLI 09/27/2008  . DEPRESSION, HX OF 09/27/2008    Sigurd Sos, PT 09/18/16 2:51 PM  [facility] [address] [place], [place], [address] Phone: [phone]   Fax:  [phone]  Name: HARUKO MERSCH MRN: [id number] Date of Birth: 1955-12-21

## 2016-09-19 DIAGNOSIS — M2550 Pain in unspecified joint: Secondary | ICD-10-CM | POA: Diagnosis not present

## 2016-09-19 DIAGNOSIS — R5382 Chronic fatigue, unspecified: Secondary | ICD-10-CM | POA: Diagnosis not present

## 2016-09-19 DIAGNOSIS — M7989 Other specified soft tissue disorders: Secondary | ICD-10-CM | POA: Diagnosis not present

## 2016-09-23 ENCOUNTER — Ambulatory Visit: Payer: BLUE CROSS/BLUE SHIELD

## 2016-09-23 DIAGNOSIS — M545 Low back pain: Secondary | ICD-10-CM | POA: Diagnosis not present

## 2016-09-23 DIAGNOSIS — G8929 Other chronic pain: Secondary | ICD-10-CM

## 2016-09-23 DIAGNOSIS — M6281 Muscle weakness (generalized): Secondary | ICD-10-CM

## 2016-09-23 DIAGNOSIS — M542 Cervicalgia: Secondary | ICD-10-CM | POA: Diagnosis not present

## 2016-09-23 NOTE — Patient Instructions (Addendum)
Do 1-2 sets of 10, 1x/day  Over Head Pull: Narrow Grip       On back, knees bent, feet flat, band across thighs, elbows straight but relaxed. Pull hands apart (start). Keeping elbows straight, bring arms up and over head, hands toward floor. Keep pull steady on band. Hold momentarily. Return slowly, keeping pull steady, back to start. Repeat __10_ times. Band color _yellow____   Side Pull: Double Arm   On back, knees bent, feet flat. Arms perpendicular to body, shoulder level, elbows straight but relaxed. Pull arms out to sides, elbows straight. Resistance band comes across collarbones, hands toward floor. Hold momentarily. Slowly return to starting position. Repeat _10__ times. Band color _yellow____   Sash   On back, knees bent, feet flat, left hand on left hip, right hand above left. Pull right arm DIAGONALLY (hip to shoulder) across chest. Bring right arm along head toward floor. Hold momentarily. Slowly return to starting position. Repeat __10_ times. Do with left arm. Band color ___yellow___   Shoulder Rotation: Double Arm   On back, knees bent, feet flat, elbows tucked at sides, bent 90, hands palms up. Pull hands apart and down toward floor, keeping elbows near sides. Hold momentarily. Slowly return to starting position. Repeat _10__ times. Band color __yellow        [facility] [address], [place], [place] [address] Phone # [phone] Fax [phone]____

## 2016-09-23 NOTE — Therapy (Signed)
[facility] [address], [place], [place], [address] Phone: [phone]   Fax:  [phone]  Physical Therapy Treatment  Patient Details  Name: Dawn Lewis MRN: [id number] Date of Birth: [birth year] Referring Provider: Dr. Melina Schools  Encounter Date: 09/23/2016      PT End of Session - 09/23/16 1446    Visit Number 7   Date for PT Re-Evaluation 10/24/16   PT Start Time 1401   PT Stop Time 1458   PT Time Calculation (min) 57 min   Activity Tolerance Patient tolerated treatment well   Behavior During Therapy [facility] for tasks assessed/performed      Past Medical History:  Diagnosis Date  . Abnormality of gait 11/11/2013  . Hiatal hernia   . Hypercholesterolemia   . Muscle cramps 11/11/2013  . Polyneuropathy in other diseases classified elsewhere ([place]) 01/06/2014  . Type 1 diabetes [facility])     Past Surgical History:  Procedure Laterality Date  . APPENDECTOMY    . CHOLECYSTECTOMY    . KNEE ARTHROSCOPY     left meniscus repair  . MANDIBLE SURGERY     extension  . PARTIAL HYSTERECTOMY    . tail bone repair     Coccyx fracture, subsequent resection  . TONSILLECTOMY      There were no vitals filed for this visit.      Subjective Assessment - 09/23/16 1401    Subjective Saw chiropractor today and she said that I have a lot of knots in Rt scapula.  Taking prednizone now.     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 Yes   Pain Score 4    Pain Location Back   Pain Orientation Right;Left   Pain Descriptors / Indicators Aching;Tightness   Pain Type Chronic pain   Pain Onset More than a month ago   Pain Frequency Constant   Aggravating Factors  sitting   Pain Relieving Factors change of position   Pain Score 4   Pain Location Neck   Pain Orientation Right;Left   Pain Descriptors / Indicators Aching;Shooting   Pain Type Chronic pain   Pain Onset More than a month ago   Pain Frequency Constant   Aggravating Factors  moving   Pain Relieving Factors finding a comfortable position                         [facility] Adult PT Treatment/Exercise - 09/23/16 0001      Neck Exercises: Supine   Other Supine Exercise supine on foam roll for decompression: yellow theraband horizontal abduction, D2, ER and overhead flexion 2x10   Other Supine Exercise thoracic mobilizaton with foam roll in supine.     Knee/Hip Exercises: Aerobic   Nustep Level 2 x 8 minutes  PT present to discuss progress     Moist Heat Therapy   Number Minutes Moist Heat 10 Minutes   Moist Heat Location Cervical;Lumbar Spine     Manual Therapy   Manual Therapy Soft tissue mobilization;Myofascial release   Manual therapy comments soft tissue elongation to bil rhomboids and subscapularis          Trigger Point Dry Needling - 09/23/16 1420    Consent Given? Yes   Muscles Treated Upper Body Upper trapezius;Rhomboids;Subscapularis   Upper Trapezius Response Twitch reponse elicited;Palpable  increased muscle length   Rhomboids Response Twitch response elicited;Palpable increased muscle length  rt only   Subscapularis Response Palpable increased muscle length;Twitch response elicited  Rt only              PT Education - 09/23/16 1416    Education provided Yes   Education Details supine yellow theraband   Person(s) Educated Patient   Methods Explanation;Demonstration;Handout   Comprehension Verbalized understanding;Returned demonstration          PT Short Term Goals - 09/23/16 1407      PT SHORT TERM GOAL #1   Title The patient will demonstrate knowledge and compliance with initial HEP including low level transverse abdominal activation and postural correction ex  09/26/16   Status Achieved     PT SHORT TERM GOAL #2   Title The patient will report a 25%  improvement in lumbar pain and discomfort with walking   Baseline 70%    Status Achieved     PT SHORT TERM GOAL #4   Title The patient will report a 25% improvement in neck/head and interscapular pain with usual ADLs and work duties   Baseline 70%   Status Achieved     PT SHORT TERM GOAL #5   Title The patient will be able to walk 1 block as needed for traveling to [place] for mission trip   Status Achieved           PT Long Term Goals - 09/12/16 0957      PT LONG TERM GOAL #1   Title The patient will be independent in safe self progression of HEP for further improvements in ROM and strength  10/24/16   Time 8   Period Weeks   Status On-going     PT LONG TERM GOAL #2   Title The patient will report a 45% improvement in neck and back pain with usual ADLS and work duties   Time 8   Period Weeks   Status On-going     PT LONG TERM GOAL #3   Title The patient will have improved deep cervical flexor, periscapular and lumbar core strength to grossly 4/5 needed for standing and walking longer periods of time with minimal pain exacerbation   Time 8   Period Weeks   Status On-going     PT LONG TERM GOAL #4   Title The patient will have improved FOTO functional outcome score to 56% to 43% indicating improved function with less pain   Time 8   Period Weeks   Status On-going     PT LONG TERM GOAL #5   Title The patient will be able to sit for 5-10 minutes with minimal increase in pain   Time 8   Period Weeks   Status On-going               Plan - 09/23/16 1409    Clinical Impression Statement Pt reports 70% overall improvement in symptoms since the start of care.  Pt able to walk longer without pain.  Pt demonstrates improved cervical A/ROM since the start of care.  Pt with tension and trigger points in thoracic spine today and demonstrate improved tissue mobility and reduced pain after dry needling today.     Rehab Potential Good   Clinical Impairments Affecting Rehab  Potential Type 1 diabetes;  bilateral neuropathies; fall risk;  going to [place] 6/22 to 7/8   PT Frequency 2x / week   PT Duration 8 weeks  PT Treatment/Interventions ADLs/Self Care Home Management;Cryotherapy;Electrical Stimulation;Moist Heat;Traction;Ultrasound;Neuromuscular re-education;Therapeutic exercise;Therapeutic activities;Manual techniques;Dry needling;Taping   PT Next Visit Plan assess response to dry needling to thoracic.  continue kinesiotaping if helpful.  Gentle mobility.  Add supine theraband to HEP if tolerated well.     Recommended Other Services initial plan of care/certification signed   Consulted and Agree with Plan of Care Patient      Patient will benefit from skilled therapeutic intervention in order to improve the following deficits and impairments:  Pain, Decreased activity tolerance, Decreased range of motion, Difficulty walking, Decreased strength  Visit Diagnosis: Chronic bilateral low back pain, with sciatica presence unspecified  Muscle weakness (generalized)  Cervicalgia     Problem List Patient Active Problem List   Diagnosis Date Noted  . Aspirin allergy 05/23/2016  . Familial hyperlipidemia 05/23/2016  . Chest pain with moderate risk of acute coronary syndrome 05/23/2016  . Polyneuropathy in other diseases classified elsewhere ([place]) 01/06/2014  . Muscle cramps 11/11/2013  . Abnormality of gait 11/11/2013  . Type 1 diabetes mellitus with complications ([place]) 09/64/3838  . Essential hypertension 09/27/2008  . MITRAL VALVE PROLAPSE 09/27/2008  . MELANOSIS COLI 09/27/2008  . DEPRESSION, HX OF 09/27/2008     Sigurd Sos, PT 09/23/16 2:52 PM  [facility] [address] [place], [place], [address] Phone: [phone]   Fax:  [phone]  Name: Dawn Lewis MRN: [id number] Date of Birth: [birth year]

## 2016-09-25 ENCOUNTER — Ambulatory Visit: Payer: BLUE CROSS/BLUE SHIELD | Admitting: Physical Therapy

## 2016-09-30 ENCOUNTER — Ambulatory Visit: Payer: BLUE CROSS/BLUE SHIELD

## 2016-09-30 DIAGNOSIS — M6281 Muscle weakness (generalized): Secondary | ICD-10-CM | POA: Diagnosis not present

## 2016-09-30 DIAGNOSIS — M542 Cervicalgia: Secondary | ICD-10-CM | POA: Diagnosis not present

## 2016-09-30 DIAGNOSIS — M545 Low back pain: Secondary | ICD-10-CM | POA: Diagnosis not present

## 2016-09-30 DIAGNOSIS — G8929 Other chronic pain: Secondary | ICD-10-CM | POA: Diagnosis not present

## 2016-09-30 NOTE — Therapy (Signed)
[facility] [address], [place], [place], [address] Phone: [phone]   Fax:  [phone]  Physical Therapy Treatment  Patient Details  Name: Dawn Lewis MRN: [id number] Date of Birth: Jul 26, 1955 Referring Provider: Dr. Melina Schools  Encounter Date: 09/30/2016      PT End of Session - 09/30/16 1458    Visit Number 8   Date for PT Re-Evaluation 10/24/16   PT Start Time 2993   PT Stop Time 1503   PT Time Calculation (min) 60 min   Activity Tolerance Patient tolerated treatment well   Behavior During Therapy [facility] for tasks assessed/performed      Past Medical History:  Diagnosis Date  . Abnormality of gait 11/11/2013  . Hiatal hernia   . Hypercholesterolemia   . Muscle cramps 11/11/2013  . Polyneuropathy in other diseases classified elsewhere ([place]) 01/06/2014  . Type 1 diabetes [facility])     Past Surgical History:  Procedure Laterality Date  . APPENDECTOMY    . CHOLECYSTECTOMY    . KNEE ARTHROSCOPY     left meniscus repair  . MANDIBLE SURGERY     extension  . PARTIAL HYSTERECTOMY    . tail bone repair     Coccyx fracture, subsequent resection  . TONSILLECTOMY      There were no vitals filed for this visit.      Subjective Assessment - 09/30/16 1400    Subjective Overall feeling 60% better.  I am leaving on Friday for mission trip.     Patient Stated Goals Gain strength so I can walk again and increase activity;  less pain   Currently in Pain? Yes   Pain Score 5    Pain Location Back   Pain Orientation Right;Left   Pain Descriptors / Indicators Aching;Tightness   Pain Type Chronic pain   Pain Onset More than a month ago   Pain Frequency Constant   Aggravating Factors  sitting, yardwork   Pain Relieving Factors change of position, kinesiotape   Pain Score 5   Pain Location Neck   Pain Orientation Right;Left   Pain Descriptors / Indicators Aching;Shooting   Pain Type Chronic pain   Pain  Onset More than a month ago   Pain Frequency Constant   Aggravating Factors  movement   Pain Relieving Factors kinesiotape, stretching                         [facility] Adult PT Treatment/Exercise - 09/30/16 0001      Neck Exercises: Machines for Strengthening   UBE (Upper Arm Bike) Level 1 x 8 minutes (3/3)     Neck Exercises: Supine   Other Supine Exercise supine on foam roll for decompression: yellow theraband horizontal abduction, D2, ER and overhead flexion 2x10   Other Supine Exercise thoracic mobilizaton with foam roll in supine.     Moist Heat Therapy   Number Minutes Moist Heat 10 Minutes   Moist Heat Location Cervical;Lumbar Spine     Manual Therapy   Manual Therapy Soft tissue mobilization;Myofascial release   Manual therapy comments soft tissue elongation to bil upper traps, Lt rhomboids and suboccipitals          Trigger Point Dry Needling - 09/30/16 1420    Consent Given? Yes   Muscles Treated Upper Body Upper trapezius;Suboccipitals muscle group;Oblique capitus   Upper Trapezius Response Twitch reponse elicited;Palpable increased muscle length   Oblique Capitus Response Twitch response elicited;Palpable increased  muscle length   SubOccipitals Response Twitch response elicited;Palpable increased muscle length   Rhomboids Response Twitch response elicited;Palpable increased muscle length  Rt only                PT Short Term Goals - 09/30/16 1409      PT SHORT TERM GOAL #1   Title The patient will demonstrate knowledge and compliance with initial HEP including low level transverse abdominal activation and postural correction ex  09/26/16   Status Achieved     PT SHORT TERM GOAL #2   Title The patient will report a 25% improvement in lumbar pain and discomfort with walking   Status Achieved     PT SHORT TERM GOAL #4   Title The patient will report a 25% improvement in neck/head and interscapular pain with usual ADLs and work duties    Status Achieved     PT SHORT TERM GOAL #5   Title The patient will be able to walk 1 block as needed for traveling to [place] for mission trip           PT [place] - 09/30/16 1409      PT LONG TERM GOAL #1   Title The patient will be independent in safe self progression of HEP for further improvements in ROM and strength  10/24/16   Time 8   Period Weeks   Status On-going     PT LONG TERM GOAL #2   Title The patient will report a 45% improvement in neck and back pain with usual ADLS and work duties   Baseline 60%   Time 8   Period Weeks   Status On-going     PT LONG TERM GOAL #5   Title The patient will be able to sit for 5-10 minutes with minimal increase in pain   Status Achieved               Plan - 09/30/16 1413    Clinical Impression Statement Pt will leave for 2.5 week trip on Friday.  Pt reports 50-70% overall improvement since the start of care.  Pt with improved cervical A/ROM since the start of care.  Pt with tension and trigger points in neck and rhomboids today and demonstrated improved tissue mobility after dry needling today.  Pt will benefit from skilled PT for 1 more session prior to trip for needling to low back and kinesiotape for long plane trip.     Clinical Impairments Affecting Rehab Potential Type 1 diabetes;  bilateral neuropathies; fall risk;  going to [place] 6/22 to 7/8   PT Frequency 2x / week   PT Duration 8 weeks   PT Treatment/Interventions ADLs/Self Care Home Management;Cryotherapy;Electrical Stimulation;Moist Heat;Traction;Ultrasound;Neuromuscular re-education;Therapeutic exercise;Therapeutic activities;Manual techniques;Dry needling;Taping   PT Next Visit Plan 1 more session before pt leaves for trip for 2.5 weeks, lumbar dry needling, kinesiotape   Consulted and Agree with Plan of Care Patient      Patient will benefit from skilled therapeutic intervention in order to improve the following deficits and impairments:  Pain,  Decreased activity tolerance, Decreased range of motion, Difficulty walking, Decreased strength  Visit Diagnosis: Chronic bilateral low back pain, with sciatica presence unspecified  Muscle weakness (generalized)  Cervicalgia     Problem List Patient Active Problem List   Diagnosis Date Noted  . Aspirin allergy 05/23/2016  . Familial hyperlipidemia 05/23/2016  . Chest pain with moderate risk of acute coronary syndrome 05/23/2016  . Polyneuropathy in other diseases  classified elsewhere ([place]) 01/06/2014  . Muscle cramps 11/11/2013  . Abnormality of gait 11/11/2013  . Type 1 diabetes mellitus with complications ([place]) 02/15/1593  . Essential hypertension 09/27/2008  . MITRAL VALVE PROLAPSE 09/27/2008  . MELANOSIS COLI 09/27/2008  . DEPRESSION, HX OF 09/27/2008    Sigurd Sos, PT 09/30/16 3:04 PM  [facility] [address] [place], [place], [address] Phone: [phone]   Fax:  [phone]  Name: FAVOUR ALESHIRE MRN: [id number] Date of Birth: [birth year]

## 2016-10-01 DIAGNOSIS — Z6823 Body mass index (BMI) 23.0-23.9, adult: Secondary | ICD-10-CM | POA: Diagnosis not present

## 2016-10-01 DIAGNOSIS — E559 Vitamin D deficiency, unspecified: Secondary | ICD-10-CM | POA: Diagnosis not present

## 2016-10-01 DIAGNOSIS — Z01419 Encounter for gynecological examination (general) (routine) without abnormal findings: Secondary | ICD-10-CM | POA: Diagnosis not present

## 2016-10-01 DIAGNOSIS — Z1231 Encounter for screening mammogram for malignant neoplasm of breast: Secondary | ICD-10-CM | POA: Diagnosis not present

## 2016-10-02 ENCOUNTER — Ambulatory Visit: Payer: BLUE CROSS/BLUE SHIELD

## 2016-10-02 DIAGNOSIS — M545 Low back pain: Principal | ICD-10-CM

## 2016-10-02 DIAGNOSIS — G8929 Other chronic pain: Secondary | ICD-10-CM | POA: Diagnosis not present

## 2016-10-02 DIAGNOSIS — M2550 Pain in unspecified joint: Secondary | ICD-10-CM | POA: Diagnosis not present

## 2016-10-02 DIAGNOSIS — M542 Cervicalgia: Secondary | ICD-10-CM | POA: Diagnosis not present

## 2016-10-02 DIAGNOSIS — K9041 Non-celiac gluten sensitivity: Secondary | ICD-10-CM | POA: Diagnosis not present

## 2016-10-02 DIAGNOSIS — M7989 Other specified soft tissue disorders: Secondary | ICD-10-CM | POA: Diagnosis not present

## 2016-10-02 DIAGNOSIS — M0609 Rheumatoid arthritis without rheumatoid factor, multiple sites: Secondary | ICD-10-CM | POA: Diagnosis not present

## 2016-10-02 DIAGNOSIS — M6281 Muscle weakness (generalized): Secondary | ICD-10-CM | POA: Diagnosis not present

## 2016-10-02 DIAGNOSIS — R5382 Chronic fatigue, unspecified: Secondary | ICD-10-CM | POA: Diagnosis not present

## 2016-10-02 NOTE — Therapy (Signed)
[facility] [address] [place], [place], [address] Phone: [phone]   Fax:  [phone]  Physical Therapy Treatment  Patient Details  Name: Dawn Lewis MRN: [id number] Date of Birth: 1955/06/10 Referring Provider: Dr. Melina Schools  Encounter Date: 10/02/2016      PT End of Session - 10/02/16 1437    Visit Number 9   Date for PT Re-Evaluation 10/24/16   PT Start Time 1401   PT Stop Time 1450  dry needling   PT Time Calculation (min) 49 min      Past Medical History:  Diagnosis Date  . Abnormality of gait 11/11/2013  . Hiatal hernia   . Hypercholesterolemia   . Muscle cramps 11/11/2013  . Polyneuropathy in other diseases classified elsewhere ([place]) 01/06/2014  . Type 1 diabetes [facility])     Past Surgical History:  Procedure Laterality Date  . APPENDECTOMY    . CHOLECYSTECTOMY    . KNEE ARTHROSCOPY     left meniscus repair  . MANDIBLE SURGERY     extension  . PARTIAL HYSTERECTOMY    . tail bone repair     Coccyx fracture, subsequent resection  . TONSILLECTOMY      There were no vitals filed for this visit.      Subjective Assessment - 10/02/16 1402    Subjective I'm leaving for a mission trip on Friday for 2.5 weeks.     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 Yes   Pain Score 5    Pain Location Back   Pain Orientation Right;Left;Lower   Pain Score 4   Pain Location Neck   Pain Orientation Right;Left                         [facility] Adult PT Treatment/Exercise - 10/02/16 0001      Knee/Hip Exercises: Aerobic   Nustep Level 2 x 6 minutes  PT present to discuss progress     Moist Heat Therapy   Number Minutes Moist Heat 10 Minutes   Moist Heat  Location Cervical;Lumbar Spine     Manual Therapy   Manual Therapy Soft tissue mobilization;Myofascial release   Manual therapy comments soft tissue elongation to bil lumbar paraspinals and gluteals     Kinesiotix   Inhibit Muscle  Star over low back          Trigger Point Dry Needling - 10/02/16 1407    Consent Given? Yes   Muscles Treated Lower Body Gluteus maximus;Gluteus minimus  lumbar multifidi   Gluteus Maximus Response Twitch response elicited;Palpable increased muscle length   Gluteus Minimus Response Twitch response elicited;Palpable increased muscle length                PT Short Term Goals - 09/30/16 1409      PT SHORT TERM GOAL #1   Title The patient will demonstrate knowledge and compliance with initial HEP including low level transverse abdominal activation and postural correction ex  09/26/16   Status Achieved     PT SHORT TERM GOAL #2   Title The patient will report a 25% improvement in lumbar pain and discomfort with walking   Status Achieved     PT SHORT TERM GOAL #4  Title The patient will report a 25% improvement in neck/head and interscapular pain with usual ADLs and work duties   Status Achieved     PT SHORT TERM GOAL #5   Title The patient will be able to walk 1 block as needed for traveling to [place] for mission trip           PT [place] - 09/30/16 1409      PT LONG TERM GOAL #1   Title The patient will be independent in safe self progression of HEP for further improvements in ROM and strength  10/24/16   Time 8   Period Weeks   Status On-going     PT LONG TERM GOAL #2   Title The patient will report a 45% improvement in neck and back pain with usual ADLS and work duties   Baseline 60%   Time 8   Period Weeks   Status On-going     PT LONG TERM GOAL #5   Title The patient will be able to sit for 5-10 minutes with minimal increase in pain   Status Achieved               Plan - 10/02/16 1403    Clinical  Impression Statement Pt will leave for 2.5 week trip on Friday.  Pt reports 50-75% overall improvement since the start of care.  Pt with improved cervical A/ROM since the start of care.  Pt with tension in bil lumbar spine today and demonstrated improved mobility and reduced pain after dry needling today.  Pt will return to PT after her trip.     Clinical Impairments Affecting Rehab Potential Type 1 diabetes;  bilateral neuropathies; fall risk;  going to [place] 6/22 to 7/8   PT Frequency 2x / week   PT Duration 8 weeks   PT Treatment/Interventions ADLs/Self Care Home Management;Cryotherapy;Electrical Stimulation;Moist Heat;Traction;Ultrasound;Neuromuscular re-education;Therapeutic exercise;Therapeutic activities;Manual techniques;Dry needling;Taping   PT Next Visit Plan Pt will return after mission trip in 2.5 weeks      Patient will benefit from skilled therapeutic intervention in order to improve the following deficits and impairments:  Pain, Decreased activity tolerance, Decreased range of motion, Difficulty walking, Decreased strength  Visit Diagnosis: Chronic bilateral low back pain, with sciatica presence unspecified  Muscle weakness (generalized)     Problem List Patient Active Problem List   Diagnosis Date Noted  . Aspirin allergy 05/23/2016  . Familial hyperlipidemia 05/23/2016  . Chest pain with moderate risk of acute coronary syndrome 05/23/2016  . Polyneuropathy in other diseases classified elsewhere ([place]) 01/06/2014  . Muscle cramps 11/11/2013  . Abnormality of gait 11/11/2013  . Type 1 diabetes mellitus with complications ([place]) 55/37/4827  . Essential hypertension 09/27/2008  . MITRAL VALVE PROLAPSE 09/27/2008  . MELANOSIS COLI 09/27/2008  . DEPRESSION, HX OF 09/27/2008     Sigurd Sos, PT 10/02/16 2:39 PM   [facility] [address] [place], [place], [address] Phone: [phone]   Fax:   [phone]  Name: Dawn Lewis MRN: [id number] Date of Birth: 12/13/1955

## 2016-10-04 ENCOUNTER — Other Ambulatory Visit: Payer: Self-pay | Admitting: Obstetrics and Gynecology

## 2016-10-04 DIAGNOSIS — R928 Other abnormal and inconclusive findings on diagnostic imaging of breast: Secondary | ICD-10-CM

## 2016-10-21 ENCOUNTER — Ambulatory Visit: Payer: BLUE CROSS/BLUE SHIELD | Attending: Orthopedic Surgery

## 2016-10-21 ENCOUNTER — Ambulatory Visit
Admission: RE | Admit: 2016-10-21 | Discharge: 2016-10-21 | Disposition: A | Discharge status: Home / Self Care | Payer: BLUE CROSS/BLUE SHIELD | Source: Ambulatory Visit | Attending: Obstetrics and Gynecology | Admitting: Obstetrics and Gynecology

## 2016-10-21 DIAGNOSIS — R928 Other abnormal and inconclusive findings on diagnostic imaging of breast: Secondary | ICD-10-CM

## 2016-10-21 DIAGNOSIS — M6281 Muscle weakness (generalized): Secondary | ICD-10-CM | POA: Diagnosis not present

## 2016-10-21 DIAGNOSIS — M542 Cervicalgia: Secondary | ICD-10-CM | POA: Insufficient documentation

## 2016-10-21 DIAGNOSIS — M545 Low back pain: Secondary | ICD-10-CM | POA: Insufficient documentation

## 2016-10-21 DIAGNOSIS — N6489 Other specified disorders of breast: Secondary | ICD-10-CM | POA: Diagnosis not present

## 2016-10-21 DIAGNOSIS — G8929 Other chronic pain: Secondary | ICD-10-CM | POA: Diagnosis not present

## 2016-10-21 NOTE — Therapy (Signed)
[facility] [address], [place], [place], [address] Phone: [phone]   Fax:  [phone]  Physical Therapy Treatment  Patient Details  Name: Dawn Lewis MRN: [id number] Date of Birth: 05/11/1955 Referring Provider: Dr. Melina Schools  Encounter Date: 10/21/2016      PT End of Session - 10/21/16 1442    Visit Number 10   Date for PT Re-Evaluation 12/09/16   PT Start Time 1400   PT Stop Time 1457   PT Time Calculation (min) 57 min   Activity Tolerance Patient tolerated treatment well      Past Medical History:  Diagnosis Date  . Abnormality of gait 11/11/2013  . Hiatal hernia   . Hypercholesterolemia   . Muscle cramps 11/11/2013  . Polyneuropathy in other diseases classified elsewhere ([place]) 01/06/2014  . Type 1 diabetes [facility])     Past Surgical History:  Procedure Laterality Date  . APPENDECTOMY    . CHOLECYSTECTOMY    . KNEE ARTHROSCOPY     left meniscus repair  . MANDIBLE SURGERY     extension  . PARTIAL HYSTERECTOMY    . tail bone repair     Coccyx fracture, subsequent resection  . TONSILLECTOMY      There were no vitals filed for this visit.      Subjective Assessment - 10/21/16 1401    Subjective Lapse in treatment as she was traveling for 2.5 weeks.  Kinesiotape helped with long flight.  40% overall improvement.     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 DRY NEEDLING:  Neck 5, Low back/gluteals 4 (as of 10/21/16)   Patient Stated Goals Gain strength so I can walk again and increase activity;  less pain   Currently in Pain? Yes   Pain Score 7    Pain Location Back   Pain Orientation Left;Right;Lower   Pain Descriptors / Indicators Aching;Tightness   Pain Type Chronic pain   Pain Onset More than a month ago   Pain Frequency Constant   Aggravating Factors  sitting, movement   Pain Relieving Factors change of position,  standing, kinesiotape   Pain Score 6   Pain Location Neck   Pain Orientation Right;Left   Pain Descriptors / Indicators Tightness   Pain Type Chronic pain   Pain Onset More than a month ago   Pain Frequency Constant   Aggravating Factors  carrying things, movement   Pain Relieving Factors kinesiotape, stretching            [facility] PT Assessment - 10/21/16 0001      Assessment   Medical Diagnosis neck and back pain     Observation/Other Assessments   Focus on Therapeutic Outcomes (FOTO)  50% limitation     AROM   Cervical - Right Side Bend 28   Cervical - Left Side Bend 30   Cervical - Right Rotation 60   Cervical - Left Rotation 80                     [facility] Adult PT Treatment/Exercise - 10/21/16 0001      Neck Exercises: Supine   Other Supine Exercise supine on foam roll for decompression: red theraband horizontal abduction, D2, ER and overhead flexion 2x10     Knee/Hip Exercises: Aerobic   Nustep Level 2 x 6 minutes  PT present to discuss progress     Moist Heat Therapy  Number Minutes Moist Heat 10 Minutes   Moist Heat Location Cervical;Lumbar Spine     Manual Therapy   Manual Therapy Soft tissue mobilization;Myofascial release   Manual therapy comments soft tissue elongation to bil lumbar paraspinals and gluteals   Kinesiotex Inhibit Muscle     Kinesiotix   Inhibit Muscle  Star over low back          Trigger Point Dry Needling - 10/21/16 1444    Muscles Treated Lower Body Gluteus minimus;Gluteus maximus  bil lumbar multifidi   Gluteus Maximus Response Twitch response elicited;Palpable increased muscle length   Gluteus Minimus Response Twitch response elicited;Palpable increased muscle length                PT Short Term Goals - 10/21/16 1405      PT SHORT TERM GOAL #1   Title The patient will demonstrate knowledge and compliance with initial HEP including low level transverse abdominal activation and postural correction ex   09/26/16   Status Achieved     PT SHORT TERM GOAL #2   Title The patient will report a 25% improvement in lumbar pain and discomfort with walking   Status Achieved     PT SHORT TERM GOAL #3   Title The patient will have improved cervical rotation and sidebending to 35 degrees bilaterally needed for driving     PT SHORT TERM GOAL #4   Title The patient will report a 25% improvement in neck/head and interscapular pain with usual ADLs and work duties   Status Achieved     PT SHORT TERM GOAL #5   Title The patient will be able to walk 1 block as needed for traveling to [place] for mission trip   Status Achieved           PT Long Term Goals - 10/21/16 1408      PT LONG TERM GOAL #1   Title The patient will be independent in safe self progression of HEP for further improvements in ROM and strength     Time 6   Period Weeks   Status On-going     PT LONG TERM GOAL #2   Title The patient will report a 75% improvement in neck and back pain with usual ADLS and work duties   Baseline 40% reported   Time 6   Period Weeks   Status Revised     PT LONG TERM GOAL #3   Title improve cervical A/ROM rotation to the Rt to > or = to 80 degrees to improve safety with driving   Time 6   Period Weeks     PT LONG TERM GOAL #4   Title The patient will have improved FOTO functional outcome score to 56% to 43% indicating improved function with less pain   Baseline 50% limitation   Time 6   Period Weeks   Status On-going     PT LONG TERM GOAL #5   Title The patient will be able to sit for 10-15 minutes with minimal increase in pain   Time 6   Period Weeks   Status Revised               Plan - 10/21/16 1415    Clinical Impression Statement Pt with lapse in treatment for 2.5 weeks due to travel.  Pt reports 40% overall imrprovement due to long travel and manual work while on trip.  cervical A/ROM rotation to the Lt is improved and still limited to the  Rt.  Pt with increased pain with  sitting ~10 minutes.  Pt with chronic pain pain in the neck and low back with functional weakness and stiffness.  Pt with limitations in function related to pain.  Pt will continue to benefit from skilled PT for manual, strength and flexibility.     Rehab Potential Good   Clinical Impairments Affecting Rehab Potential Type 1 diabetes;  bilateral neuropathies; fall risk;  going to [place] 6/22 to 7/8   PT Frequency 2x / week   PT Duration 6 weeks   PT Treatment/Interventions ADLs/Self Care Home Management;Cryotherapy;Electrical Stimulation;Moist Heat;Traction;Ultrasound;Neuromuscular re-education;Therapeutic exercise;Therapeutic activities;Manual techniques;Dry needling;Taping   PT Next Visit Plan dry needling, strength, flexibility, manual, modalities as needed.     Recommended Other Services recert sent 10/15/69   Consulted and Agree with Plan of Care Patient      Patient will benefit from skilled therapeutic intervention in order to improve the following deficits and impairments:  Pain, Decreased activity tolerance, Decreased range of motion, Difficulty walking, Decreased strength  Visit Diagnosis: Chronic bilateral low back pain, with sciatica presence unspecified - Plan: PT plan of care cert/re-cert  Muscle weakness (generalized) - Plan: PT plan of care cert/re-cert  Cervicalgia - Plan: PT plan of care cert/re-cert     Problem List Patient Active Problem List   Diagnosis Date Noted  . Aspirin allergy 05/23/2016  . Familial hyperlipidemia 05/23/2016  . Chest pain with moderate risk of acute coronary syndrome 05/23/2016  . Polyneuropathy in other diseases classified elsewhere ([place]) 01/06/2014  . Muscle cramps 11/11/2013  . Abnormality of gait 11/11/2013  . Type 1 diabetes mellitus with complications ([place]) 10/09/9483  . Essential hypertension 09/27/2008  . MITRAL VALVE PROLAPSE 09/27/2008  . MELANOSIS COLI 09/27/2008  . DEPRESSION, HX OF 09/27/2008     Sigurd Sos, PT 10/21/16  2:47 PM  [facility] [address] [place], [place], [address] Phone: [phone]   Fax:  [phone]  Name: BREZLYN MANRIQUE MRN: [id number] Date of Birth: [birth year]

## 2016-10-23 ENCOUNTER — Ambulatory Visit: Payer: BLUE CROSS/BLUE SHIELD

## 2016-10-23 DIAGNOSIS — M545 Low back pain: Secondary | ICD-10-CM | POA: Diagnosis not present

## 2016-10-23 DIAGNOSIS — M542 Cervicalgia: Secondary | ICD-10-CM | POA: Diagnosis not present

## 2016-10-23 DIAGNOSIS — G8929 Other chronic pain: Secondary | ICD-10-CM | POA: Diagnosis not present

## 2016-10-23 DIAGNOSIS — M6281 Muscle weakness (generalized): Secondary | ICD-10-CM | POA: Diagnosis not present

## 2016-10-23 NOTE — Therapy (Signed)
[facility] [address] [place], [place], [address] Phone: [phone]   Fax:  [phone]  Physical Therapy Treatment  Patient Details  Name: Dawn Lewis MRN: [id number] Date of Birth: 08/03/1955 Referring Provider: Dr. Melina Schools  Encounter Date: 10/23/2016      PT End of Session - 10/23/16 1444    Visit Number 11   Date for PT Re-Evaluation 12/09/16   PT Start Time 1401   PT Stop Time 1456   PT Time Calculation (min) 55 min   Activity Tolerance Patient tolerated treatment well   Behavior During Therapy [facility] for tasks assessed/performed      Past Medical History:  Diagnosis Date  . Abnormality of gait 11/11/2013  . Hiatal hernia   . Hypercholesterolemia   . Muscle cramps 11/11/2013  . Polyneuropathy in other diseases classified elsewhere ([place]) 01/06/2014  . Type 1 diabetes [facility])     Past Surgical History:  Procedure Laterality Date  . APPENDECTOMY    . CHOLECYSTECTOMY    . KNEE ARTHROSCOPY     left meniscus repair  . MANDIBLE SURGERY     extension  . PARTIAL HYSTERECTOMY    . tail bone repair     Coccyx fracture, subsequent resection  . TONSILLECTOMY      There were no vitals filed for this visit.      Subjective Assessment - 10/23/16 1401    Subjective I'm feeling better today.     How long can you sit comfortably? DRY NEEDLING:  Neck 6, Low back/gluteals 4 (as of 10/23/16)   Currently in Pain? Yes   Pain Score 5    Pain Location Back   Pain Orientation Right;Left;Lower   Pain Score 4   Pain Location Neck   Pain Orientation Right;Left                         [facility] Adult PT Treatment/Exercise - 10/23/16 0001      Neck Exercises: Seated   Shoulder Flexion Both;20 reps;Weights   Shoulder Flexion Weights (lbs) 1   Shoulder ABduction Both;20 reps;Weights   Shoulder Abduction Weights (lbs) 1   Shoulder Abduction Limitations scaption 1# 2x10 bil.      Knee/Hip  Exercises: Aerobic   Nustep Level 2 x 6 minutes  PT present to discuss progress     Moist Heat Therapy   Number Minutes Moist Heat 10 Minutes   Moist Heat Location Cervical;Lumbar Spine     Manual Therapy   Manual Therapy Soft tissue mobilization;Myofascial release   Manual therapy comments soft tissue elongation to bil neck and upper traps   Kinesiotex Inhibit Muscle     Kinesiotix   Inhibit Muscle  Star over low back          Trigger Point Dry Needling - 10/23/16 1408    Consent Given? Yes   Muscles Treated Upper Body Upper trapezius;Oblique capitus;Suboccipitals muscle group;Rhomboids   Upper Trapezius Response Palpable increased muscle length;Twitch reponse elicited   Oblique Capitus Response Twitch response elicited;Palpable increased muscle length   SubOccipitals Response Twitch response elicited;Palpable increased muscle length   Rhomboids Response Palpable increased muscle length;Twitch response elicited                PT Short Term Goals - 10/21/16 1405      PT SHORT TERM GOAL #1   Title The patient will demonstrate knowledge and compliance with initial HEP including low level transverse  abdominal activation and postural correction ex  09/26/16   Status Achieved     PT SHORT TERM GOAL #2   Title The patient will report a 25% improvement in lumbar pain and discomfort with walking   Status Achieved     PT SHORT TERM GOAL #3   Title The patient will have improved cervical rotation and sidebending to 35 degrees bilaterally needed for driving     PT SHORT TERM GOAL #4   Title The patient will report a 25% improvement in neck/head and interscapular pain with usual ADLs and work duties   Status Achieved     PT SHORT TERM GOAL #5   Title The patient will be able to walk 1 block as needed for traveling to [place] for mission trip   Status Achieved           PT Long Term Goals - 10/21/16 1408      PT LONG TERM GOAL #1   Title The patient will be  independent in safe self progression of HEP for further improvements in ROM and strength     Time 6   Period Weeks   Status On-going     PT LONG TERM GOAL #2   Title The patient will report a 75% improvement in neck and back pain with usual ADLS and work duties   Baseline 40% reported   Time 6   Period Weeks   Status Revised     PT LONG TERM GOAL #3   Title improve cervical A/ROM rotation to the Rt to > or = to 80 degrees to improve safety with driving   Time 6   Period Weeks     PT LONG TERM GOAL #4   Title The patient will have improved FOTO functional outcome score to 56% to 43% indicating improved function with less pain   Baseline 50% limitation   Time 6   Period Weeks   Status On-going     PT LONG TERM GOAL #5   Title The patient will be able to sit for 10-15 minutes with minimal increase in pain   Time 6   Period Weeks   Status Revised               Plan - 10/23/16 1403    Clinical Impression Statement Focus today was on neck and thoracic spine today.  Pt just got back from a long trip and had a flare-up after travel.  Cervical A/ROM rotation to the Lt is improved and still limited to the Rt.  Pt with trigger points and tension in the neck and thoracic spine and demonstrated improved mobility and reduced pain after dry needling and manual therapy today.  Pt will continue to benefit from skilled PT for strength, flexibility and endurance.     Rehab Potential Good   Clinical Impairments Affecting Rehab Potential Type 1 diabetes;  bilateral neuropathies; fall risk;  going to [place] 6/22 to 7/8   PT Frequency 2x / week   PT Duration 6 weeks   PT Treatment/Interventions ADLs/Self Care Home Management;Cryotherapy;Electrical Stimulation;Moist Heat;Traction;Ultrasound;Neuromuscular re-education;Therapeutic exercise;Therapeutic activities;Manual techniques;Dry needling;Taping   PT Next Visit Plan dry needling, strength, flexibility, manual, modalities as needed.      Recommended Other Services recert signed    Consulted and Agree with Plan of Care Patient      Patient will benefit from skilled therapeutic intervention in order to improve the following deficits and impairments:  Pain, Decreased activity tolerance, Decreased range of motion,  Difficulty walking, Decreased strength  Visit Diagnosis: Chronic bilateral low back pain, with sciatica presence unspecified  Muscle weakness (generalized)  Cervicalgia     Problem List Patient Active Problem List   Diagnosis Date Noted  . Aspirin allergy 05/23/2016  . Familial hyperlipidemia 05/23/2016  . Chest pain with moderate risk of acute coronary syndrome 05/23/2016  . Polyneuropathy in other diseases classified elsewhere ([place]) 01/06/2014  . Muscle cramps 11/11/2013  . Abnormality of gait 11/11/2013  . Type 1 diabetes mellitus with complications ([place]) 50/12/3816  . Essential hypertension 09/27/2008  . MITRAL VALVE PROLAPSE 09/27/2008  . MELANOSIS COLI 09/27/2008  . DEPRESSION, HX OF 09/27/2008     Sigurd Sos, PT 10/23/16 2:45 PM  [facility] [address], [place], [place], [address] Phone: [phone]   Fax:  [phone]  Name: Dawn Lewis MRN: [id number] Date of Birth: 01-Apr-1956

## 2016-10-24 DIAGNOSIS — E1040 Type 1 diabetes mellitus with diabetic neuropathy, unspecified: Secondary | ICD-10-CM | POA: Diagnosis not present

## 2016-10-24 DIAGNOSIS — M069 Rheumatoid arthritis, unspecified: Secondary | ICD-10-CM | POA: Diagnosis not present

## 2016-10-24 DIAGNOSIS — Z4681 Encounter for fitting and adjustment of insulin pump: Secondary | ICD-10-CM | POA: Diagnosis not present

## 2016-11-05 ENCOUNTER — Ambulatory Visit: Payer: BLUE CROSS/BLUE SHIELD

## 2016-11-05 DIAGNOSIS — M545 Low back pain: Secondary | ICD-10-CM | POA: Diagnosis not present

## 2016-11-05 DIAGNOSIS — M6281 Muscle weakness (generalized): Secondary | ICD-10-CM | POA: Diagnosis not present

## 2016-11-05 DIAGNOSIS — G8929 Other chronic pain: Secondary | ICD-10-CM | POA: Diagnosis not present

## 2016-11-05 DIAGNOSIS — M542 Cervicalgia: Secondary | ICD-10-CM

## 2016-11-05 NOTE — Therapy (Signed)
[facility] [address] [place], [place], [address] Phone: [phone]   Fax:  [phone]  Physical Therapy Treatment  Patient Details  Name: Dawn Lewis MRN: [id number] Date of Birth: [birth year] Referring Provider: Dr. Melina Schools  Encounter Date: 11/05/2016      PT End of Session - 11/05/16 1021    Visit Number 12   Date for PT Re-Evaluation 12/09/16   PT Start Time 0938   PT Stop Time 1033   PT Time Calculation (min) 55 min   Activity Tolerance Patient tolerated treatment well   Behavior During Therapy [facility] for tasks assessed/performed      Past Medical History:  Diagnosis Date  . Abnormality of gait 11/11/2013  . Hiatal hernia   . Hypercholesterolemia   . Muscle cramps 11/11/2013  . Polyneuropathy in other diseases classified elsewhere ([place]) 01/06/2014  . Type 1 diabetes [facility])     Past Surgical History:  Procedure Laterality Date  . APPENDECTOMY    . CHOLECYSTECTOMY    . KNEE ARTHROSCOPY     left meniscus repair  . MANDIBLE SURGERY     extension  . PARTIAL HYSTERECTOMY    . tail bone repair     Coccyx fracture, subsequent resection  . TONSILLECTOMY      There were no vitals filed for this visit.      Subjective Assessment - 11/05/16 0939    Subjective I went to [place] last week with my daughter.  I am feeling a little achy from all of the travel.     How long can you sit comfortably? 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 Yes   Pain Score 7    Pain Location Back   Pain Orientation Right;Left;Lower   Pain Descriptors / Indicators Aching;Tightness;Burning   Pain Type Chronic pain   Pain Onset More than a  month ago   Pain Frequency Constant   Aggravating Factors  sitting, movement   Pain Relieving Factors change of position, standing, kinesiotape   Pain Score 7   Pain Location Neck   Pain Orientation Right;Left   Pain Descriptors / Indicators Tightness   Pain Type Chronic pain   Pain Onset More than a month ago   Pain Frequency Constant   Aggravating Factors  movement   Pain Relieving Factors kinesiotape, stretching            [facility] PT Assessment - 11/05/16 0001      AROM   Cervical - Right Side Bend 25   Cervical - Left Side Bend 25   Cervical - Right Rotation 45   Cervical - Left Rotation 50                     [facility] Adult PT Treatment/Exercise - 11/05/16 0001      Neck Exercises: Machines for Strengthening   UBE (Upper Arm Bike) Level 1 x 6 minutes (3/3)     Neck Exercises: Seated   Shoulder Flexion Both;20 reps;Weights   Shoulder Flexion Weights (lbs) 1   Shoulder ABduction Both;20 reps;Weights   Shoulder Abduction Weights (lbs) 1   Shoulder Abduction Limitations scaption 1# 2x10  Moist Heat Therapy   Number Minutes Moist Heat 10 Minutes   Moist Heat Location Cervical;Lumbar Spine     Manual Therapy   Manual Therapy Soft tissue mobilization;Myofascial release   Manual therapy comments soft tissue elongation to bil lumbar paraspinals and gluteals   Kinesiotex Inhibit Muscle     Kinesiotix   Inhibit Muscle  Star over low back          Trigger Point Dry Needling - 11/05/16 0953    Consent Given? Yes   Muscles Treated Lower Body Gluteus maximus;Gluteus minimus  bil lumbar paraspinals and multifidi   Gluteus Maximus Response Twitch response elicited;Palpable increased muscle length   Gluteus Minimus Response Twitch response elicited;Palpable increased muscle length                PT Short Term Goals - 11/05/16 0941      PT SHORT TERM GOAL #3   Title The patient will have improved cervical rotation and sidebending to 35 degrees  bilaterally needed for driving   Time 4   Period Weeks   Status On-going           PT Long Term Goals - 11/05/16 6803      PT LONG TERM GOAL #1   Title The patient will be independent in safe self progression of HEP for further improvements in ROM and strength     Time 6   Period Weeks   Status On-going     PT LONG TERM GOAL #2   Title The patient will report a 75% improvement in neck and back pain with usual ADLS and work duties   Baseline 40% reported   Time 6   Period Weeks   Status On-going     PT LONG TERM GOAL #5   Title The patient will be able to sit for 10-15 minutes with minimal increase in pain   Time 6   Period Weeks   Status On-going               Plan - 11/05/16 2122    Clinical Impression Statement Pt with lapse in treatment due to travel.  Pt with increased pain and stiffness today due to travel.  Pt with tension and trigger points in bil lumbar paraspinals and gluteals and demonstrated improved mobility s/p dry needling.  Pt will continue to benefit from skilled PT for flexibility, strength, manual and modalities as needed.     Rehab Potential Good   Clinical Impairments Affecting Rehab Potential Type 1 diabetes;  bilateral neuropathies; fall risk;  going to [place] 6/22 to 7/8   PT Frequency 2x / week   PT Duration 6 weeks   PT Treatment/Interventions ADLs/Self Care Home Management;Cryotherapy;Electrical Stimulation;Moist Heat;Traction;Ultrasound;Neuromuscular re-education;Therapeutic exercise;Therapeutic activities;Manual techniques;Dry needling;Taping   PT Next Visit Plan dry needling, strength, flexibility, manual, modalities as needed.     Consulted and Agree with Plan of Care Patient      Patient will benefit from skilled therapeutic intervention in order to improve the following deficits and impairments:  Pain, Decreased activity tolerance, Decreased range of motion, Difficulty walking, Decreased strength  Visit Diagnosis: Chronic bilateral  low back pain, with sciatica presence unspecified  Muscle weakness (generalized)  Cervicalgia     Problem List Patient Active Problem List   Diagnosis Date Noted  . Aspirin allergy 05/23/2016  . Familial hyperlipidemia 05/23/2016  . Chest pain with moderate risk of acute coronary syndrome 05/23/2016  . Polyneuropathy in other diseases classified elsewhere ([place]) 01/06/2014  .  Muscle cramps 11/11/2013  . Abnormality of gait 11/11/2013  . Type 1 diabetes mellitus with complications ([place]) 37/16/9678  . Essential hypertension 09/27/2008  . MITRAL VALVE PROLAPSE 09/27/2008  . MELANOSIS COLI 09/27/2008  . DEPRESSION, HX OF 09/27/2008    Sigurd Sos, PT 11/05/16 10:23 AM  [facility] [address], [place], [place], [address] Phone: [phone]   Fax:  [phone]  Name: Dawn Lewis MRN: [id number] Date of Birth: 1956-03-16

## 2016-11-06 ENCOUNTER — Telehealth: Payer: Self-pay | Admitting: Genetics

## 2016-11-06 ENCOUNTER — Encounter: Payer: Self-pay | Admitting: Genetics

## 2016-11-06 DIAGNOSIS — E109 Type 1 diabetes mellitus without complications: Secondary | ICD-10-CM | POA: Diagnosis not present

## 2016-11-06 DIAGNOSIS — N941 Unspecified dyspareunia: Secondary | ICD-10-CM | POA: Diagnosis not present

## 2016-11-06 NOTE — Telephone Encounter (Signed)
Genetic counseling appt has been scheduled for the pt to see Ria Comment on 8/7 at 1pm. Pt aware to arrive 15 minutes early. Letter mailed.

## 2016-11-08 ENCOUNTER — Encounter: Payer: BLUE CROSS/BLUE SHIELD | Admitting: Physical Therapy

## 2016-11-08 DIAGNOSIS — E109 Type 1 diabetes mellitus without complications: Secondary | ICD-10-CM | POA: Diagnosis not present

## 2016-11-11 ENCOUNTER — Ambulatory Visit: Payer: BLUE CROSS/BLUE SHIELD

## 2016-11-11 DIAGNOSIS — M6281 Muscle weakness (generalized): Secondary | ICD-10-CM | POA: Diagnosis not present

## 2016-11-11 DIAGNOSIS — G8929 Other chronic pain: Secondary | ICD-10-CM

## 2016-11-11 DIAGNOSIS — M545 Low back pain: Secondary | ICD-10-CM | POA: Diagnosis not present

## 2016-11-11 DIAGNOSIS — M542 Cervicalgia: Secondary | ICD-10-CM

## 2016-11-11 NOTE — Therapy (Signed)
[facility] [address] [place], [place], [address] Phone: [phone]   Fax:  [phone]  Physical Therapy Treatment  Patient Details  Name: Dawn Lewis MRN: [id number] Date of Birth: 29-Apr-1955 Referring Provider: Dr. Melina Schools  Encounter Date: 11/11/2016      PT End of Session - 11/11/16 1534    Visit Number 13   Date for PT Re-Evaluation 12/09/16   PT Start Time 1445   PT Stop Time 1541   PT Time Calculation (min) 56 min   Activity Tolerance Patient tolerated treatment well   Behavior During Therapy [facility] for tasks assessed/performed      Past Medical History:  Diagnosis Date  . Abnormality of gait 11/11/2013  . Hiatal hernia   . Hypercholesterolemia   . Muscle cramps 11/11/2013  . Polyneuropathy in other diseases classified elsewhere ([place]) 01/06/2014  . Type 1 diabetes [facility])     Past Surgical History:  Procedure Laterality Date  . APPENDECTOMY    . CHOLECYSTECTOMY    . KNEE ARTHROSCOPY     left meniscus repair  . MANDIBLE SURGERY     extension  . PARTIAL HYSTERECTOMY    . tail bone repair     Coccyx fracture, subsequent resection  . TONSILLECTOMY      There were no vitals filed for this visit.      Subjective Assessment - 11/11/16 1446    Subjective Drove to [place] over the weekend.  The kinesiotape has really helped.     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 Yes   Pain Score 5    Pain Location Back   Pain Orientation Right;Left;Lower   Pain Descriptors / Indicators Aching;Tightness;Burning   Pain Type Chronic pain   Pain Onset More than a month ago   Pain Frequency Constant   Aggravating Factors  sitting, movement   Pain Relieving Factors change of position, standing, kinesiotape   Pain Score 5   Pain Location Neck   Pain Orientation Right;Left   Pain Descriptors / Indicators Tightness   Pain Type Chronic pain   Pain Onset More than a month ago   Pain Frequency Constant   Aggravating Factors  movement   Pain Relieving Factors kinesiotape, stretching                         [facility] Adult PT Treatment/Exercise - 11/11/16 0001      Neck Exercises: Machines for Strengthening   UBE (Upper Arm Bike) Level 1 x 8 minutes (4/4)     Neck Exercises: Seated   Shoulder Flexion Both;20 reps;Weights   Shoulder Flexion Weights (lbs) 1   Shoulder ABduction Both;20 reps;Weights   Shoulder Abduction Weights (lbs) 1   Shoulder Abduction Limitations scaption 1# 2x10     Moist Heat Therapy   Number Minutes Moist Heat 10 Minutes   Moist Heat Location Cervical;Lumbar Spine     Manual Therapy   Manual Therapy  Soft tissue mobilization;Myofascial release   Manual therapy comments soft tissue elongation to bil lumbar paraspinals and gluteals   Kinesiotex Inhibit Muscle     Kinesiotix   Inhibit Muscle  Star over low back, I strips over bil upper traps          Trigger Point Dry Needling - 11/11/16 1509    Consent Given? Yes   Muscles Treated Lower Body Gluteus minimus;Gluteus maximus  bil lumbar multifidi and paraspinals   Gluteus Maximus Response Twitch response elicited;Palpable increased muscle length   Gluteus Minimus Response Twitch response elicited;Palpable increased muscle length                PT Short Term Goals - 11/05/16 0941      PT SHORT TERM GOAL #3   Title The patient will have improved cervical rotation and sidebending to 35 degrees bilaterally  needed for driving   Time 4   Period Weeks   Status On-going           PT Long Term Goals - 11/11/16 1451      PT LONG TERM GOAL #1   Title The patient will be independent in safe self progression of HEP for further improvements in ROM and strength     Time 6   Period Weeks   Status On-going     PT LONG TERM GOAL #2   Title The patient will report a 75% improvement in neck and back pain with usual ADLS and work duties   Baseline 40% reported   Time 6   Period Weeks   Status On-going     PT LONG TERM GOAL #3   Title improve cervical A/ROM rotation to the Rt to > or = to 80 degrees to improve safety with driving   Time 6   Period Weeks   Status On-going     PT LONG TERM GOAL #4   Title The patient will have improved FOTO functional outcome score to 56% to 43% indicating improved function with less pain   Baseline 50% limitation   Time 6   Period Weeks   Status On-going     PT LONG TERM GOAL #5   Title The patient will be able to sit for 10-15 minutes with minimal increase in pain   Time 6   Period Weeks   Status On-going               Plan - 11/11/16 1452    Clinical Impression Statement Pt has been traveling with long car ride and has been working a lot so not a lot of significant change this week.  Pt might be leaving for [facility] so requested dry needling to low back today.  Pt with tension in bil lumbar paraspinals and demonstrated improved mobility after dry needling today.  Pt will continue to benefit from skilled PT for flexibility, strength, manual and modalities as needed.     Rehab Potential Good   Clinical Impairments Affecting Rehab Potential Type 1 diabetes;  bilateral neuropathies; fall risk;  going to [place] 6/22 to 7/8   PT Frequency 2x / week   PT Duration 6 weeks   PT Treatment/Interventions ADLs/Self Care Home Management;Cryotherapy;Electrical Stimulation;Moist Heat;Traction;Ultrasound;Neuromuscular re-education;Therapeutic exercise;Therapeutic  activities;Manual techniques;Dry needling;Taping   PT Next Visit Plan strength, flexibility, manual, modalities as needed.     Consulted and Agree with Plan of Care Patient      Patient will benefit from skilled therapeutic intervention in order to improve the following  deficits and impairments:  Pain, Decreased activity tolerance, Decreased range of motion, Difficulty walking, Decreased strength  Visit Diagnosis: Chronic bilateral low back pain, with sciatica presence unspecified  Muscle weakness (generalized)  Cervicalgia     Problem List Patient Active Problem List   Diagnosis Date Noted  . Aspirin allergy 05/23/2016  . Familial hyperlipidemia 05/23/2016  . Chest pain with moderate risk of acute coronary syndrome 05/23/2016  . Polyneuropathy in other diseases classified elsewhere ([place]) 01/06/2014  . Muscle cramps 11/11/2013  . Abnormality of gait 11/11/2013  . Type 1 diabetes mellitus with complications ([place]) 53/61/4431  . Essential hypertension 09/27/2008  . MITRAL VALVE PROLAPSE 09/27/2008  . MELANOSIS COLI 09/27/2008  . DEPRESSION, HX OF 09/27/2008    Sigurd Sos, PT 11/11/16 3:36 PM  [facility] [address], [place], [place], [address] Phone: [phone]   Fax:  [phone]  Name: Dawn Lewis MRN: [id number] Date of Birth: November 22, 1955

## 2016-11-13 ENCOUNTER — Ambulatory Visit: Payer: BLUE CROSS/BLUE SHIELD | Attending: Orthopedic Surgery

## 2016-11-13 DIAGNOSIS — G8929 Other chronic pain: Secondary | ICD-10-CM | POA: Diagnosis not present

## 2016-11-13 DIAGNOSIS — M545 Low back pain: Secondary | ICD-10-CM | POA: Diagnosis not present

## 2016-11-13 DIAGNOSIS — M542 Cervicalgia: Secondary | ICD-10-CM | POA: Diagnosis not present

## 2016-11-13 DIAGNOSIS — M6281 Muscle weakness (generalized): Secondary | ICD-10-CM | POA: Insufficient documentation

## 2016-11-13 NOTE — Therapy (Signed)
[facility] [address], [place], [place], [address] Phone: [phone]   Fax:  [phone]  Physical Therapy Treatment  Patient Details  Name: Dawn Lewis MRN: [id number] Date of Birth: 08-08-1955 Referring Provider: Dr. Melina Schools  Encounter Date: 11/13/2016      PT End of Session - 11/13/16 1526    Visit Number 14   Date for PT Re-Evaluation 12/09/16   PT Start Time 1443   PT Stop Time 1536   PT Time Calculation (min) 53 min   Activity Tolerance Patient tolerated treatment well   Behavior During Therapy [facility] for tasks assessed/performed      Past Medical History:  Diagnosis Date  . Abnormality of gait 11/11/2013  . Hiatal hernia   . Hypercholesterolemia   . Muscle cramps 11/11/2013  . Polyneuropathy in other diseases classified elsewhere ([place]) 01/06/2014  . Type 1 diabetes [facility])     Past Surgical History:  Procedure Laterality Date  . APPENDECTOMY    . CHOLECYSTECTOMY    . KNEE ARTHROSCOPY     left meniscus repair  . MANDIBLE SURGERY     extension  . PARTIAL HYSTERECTOMY    . tail bone repair     Coccyx fracture, subsequent resection  . TONSILLECTOMY      There were no vitals filed for this visit.      Subjective Assessment - 11/13/16 1445    Subjective Tape on neck caused increased neck pain and headache this time.  I removed the tape and I am still having pain-it feels like nerve pain.     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 Yes   Pain Location Back   Pain Orientation Right;Left;Lower   Pain Descriptors / Indicators Aching;Tightness   Pain Score 5   Pain Location Neck   Pain Orientation Right;Left                         [facility] Adult PT Treatment/Exercise - 11/13/16 0001      Neck Exercises: Seated   Shoulder Flexion Both;20 reps;Weights  seated on green ball   Shoulder Flexion Weights (lbs) 1   Shoulder ABduction Both;20 reps;Weights  seated on green ball   Shoulder Abduction Weights (lbs) 1   Shoulder Abduction Limitations scaption 1# 2x10  seated on ball     Neck Exercises: Supine   Other Supine Exercise Melt method for neck mobilization     Knee/Hip Exercises: Stretches   Active Hamstring Stretch Both;3 reps;20 seconds     Knee/Hip Exercises: Aerobic   Nustep Level 2 x 8 minutes     Modalities   Modalities Traction     Moist Heat Therapy   Number Minutes Moist Heat 10 Minutes   Moist Heat Location Lumbar Spine  concurrent with traction     Traction   Type of Traction Cervical   Min (lbs) 5   Max (lbs) 14   Hold Time 60  Rest Time 20   Time 10     Manual Therapy   Kinesiotex Inhibit Muscle     Kinesiotix   Inhibit Muscle  Star over low back, I strips over rhomboids      Neck Exercises: Stretches   Upper Trapezius Stretch 3 reps;20 seconds                  PT Short Term Goals - 11/05/16 0941      PT SHORT TERM GOAL #3   Title The patient will have improved cervical rotation and sidebending to 35 degrees bilaterally needed for driving   Time 4   Period Weeks   Status On-going           PT Long Term Goals - 11/13/16 1449      PT LONG TERM GOAL #1   Title The patient will be independent in safe self progression of HEP for further improvements in ROM and strength     Time 6   Period Weeks   Status On-going               Plan - 11/13/16 1528    Clinical Impression Statement Pt with increased neck pain and  headache today.  PT focused on postural strength, core strength and flexiblity today.  Trial of traction for neck pain/stenosis.  Pt will continue to benefit from skilled PT for strength, flexibility, manual and modalities as needed.     Clinical Impairments Affecting Rehab Potential Type 1 diabetes;  bilateral neuropathies; fall risk;  going to [place] 6/22 to 7/8   PT Frequency 2x / week   PT Duration 6 weeks   PT Treatment/Interventions ADLs/Self Care Home Management;Cryotherapy;Electrical Stimulation;Moist Heat;Traction;Ultrasound;Neuromuscular re-education;Therapeutic exercise;Therapeutic activities;Manual techniques;Dry needling;Taping   PT Next Visit Plan strength, flexibility, manual, modalities as needed. Assess response to traction.    Consulted and Agree with Plan of Care Patient      Patient will benefit from skilled therapeutic intervention in order to improve the following deficits and impairments:  Pain, Decreased activity tolerance, Decreased range of motion, Difficulty walking, Decreased strength  Visit Diagnosis: Chronic bilateral low back pain, with sciatica presence unspecified  Muscle weakness (generalized)  Cervicalgia     Problem List Patient Active Problem List   Diagnosis Date Noted  . Aspirin allergy 05/23/2016  . Familial hyperlipidemia 05/23/2016  . Chest pain with moderate risk of acute coronary syndrome 05/23/2016  . Polyneuropathy in other diseases classified elsewhere ([place]) 01/06/2014  . Muscle cramps 11/11/2013  . Abnormality of gait 11/11/2013  . Type 1 diabetes mellitus with complications ([place]) 41/42/3953  . Essential hypertension 09/27/2008  . MITRAL VALVE PROLAPSE 09/27/2008  . MELANOSIS COLI 09/27/2008  . DEPRESSION, HX OF 09/27/2008     Sigurd Sos, PT 11/13/16 3:30 PM  [facility] [address], [place], [place], [address] Phone: [phone]   Fax:  [phone]  Name:  Dawn Lewis MRN: [id number] Date of Birth: June 09, 1955

## 2016-11-18 ENCOUNTER — Ambulatory Visit: Payer: BLUE CROSS/BLUE SHIELD

## 2016-11-18 DIAGNOSIS — M545 Low back pain: Principal | ICD-10-CM

## 2016-11-18 DIAGNOSIS — M6281 Muscle weakness (generalized): Secondary | ICD-10-CM

## 2016-11-18 DIAGNOSIS — G8929 Other chronic pain: Secondary | ICD-10-CM

## 2016-11-18 DIAGNOSIS — M542 Cervicalgia: Secondary | ICD-10-CM

## 2016-11-18 NOTE — Therapy (Signed)
[facility] [address] [place], [place], [address] Phone: [phone]   Fax:  [phone]  Physical Therapy Treatment  Patient Details  Name: Dawn Lewis MRN: [id number] Date of Birth: 11-03-1955 Referring Provider: Dr. Melina Schools  Encounter Date: 11/18/2016      PT End of Session - 11/18/16 1030    Visit Number 15   Date for PT Re-Evaluation 12/09/16   PT Start Time 0943   PT Stop Time 1027   PT Time Calculation (min) 44 min   Activity Tolerance Patient tolerated treatment well   Behavior During Therapy [facility] for tasks assessed/performed      Past Medical History:  Diagnosis Date  . Abnormality of gait 11/11/2013  . Hiatal hernia   . Hypercholesterolemia   . Muscle cramps 11/11/2013  . Polyneuropathy in other diseases classified elsewhere ([place]) 01/06/2014  . Type 1 diabetes [facility])     Past Surgical History:  Procedure Laterality Date  . APPENDECTOMY    . CHOLECYSTECTOMY    . KNEE ARTHROSCOPY     left meniscus repair  . MANDIBLE SURGERY     extension  . PARTIAL HYSTERECTOMY    . tail bone repair     Coccyx fracture, subsequent resection  . TONSILLECTOMY      There were no vitals filed for this visit.      Subjective Assessment - 11/18/16 0946    Subjective The traction helped.  I did well over the weekend.     Patient Stated Goals Gain strength so I can walk again and increase activity;  less pain   Currently in Pain? Yes   Pain Score 5    Pain Location Back   Pain Orientation Right;Left;Lower   Pain Descriptors / Indicators Aching;Tightness   Pain Type Chronic pain   Pain Onset More than a month ago   Pain Frequency Constant   Aggravating Factors  sitting, movement   Pain Relieving Factors change of position, standing, kiniesiotape   Pain Score 5   Pain Location Neck   Pain Orientation Right;Left   Pain Descriptors / Indicators Tightness   Pain Onset More than a month ago   Pain  Frequency Constant   Aggravating Factors  movement   Pain Relieving Factors kinesiotape, stretching            [facility] PT Assessment - 11/18/16 0001      AROM   Cervical - Right Rotation 60   Cervical - Left Rotation 70                     [facility] Adult PT Treatment/Exercise - 11/18/16 0001      Neck Exercises: Machines for Strengthening   UBE (Upper Arm Bike) Level 1 x 8 minutes (4/4)     Neck Exercises: Seated   Shoulder Flexion Both;20 reps;Weights  seated on green ball   Shoulder Flexion Weights (lbs) 3   Shoulder ABduction Both;20 reps;Weights  seated on green ball   Shoulder Abduction Weights (lbs) 3   Shoulder Abduction Limitations scaption 3# 2x10     Modalities   Modalities Traction     Moist Heat Therapy   Number Minutes Moist Heat --   Moist Heat Location --     Traction   Type of Traction Cervical   Min (lbs) 5   Max (lbs) 15   Hold Time 60   Rest Time 20   Time 10     Manual  Therapy   Kinesiotex Inhibit Muscle     Kinesiotix   Inhibit Muscle  Star over low back, I strips over rhomboids      Neck Exercises: Stretches   Upper Trapezius Stretch 3 reps;20 seconds                  PT Short Term Goals - 11/05/16 0941      PT SHORT TERM GOAL #3   Title The patient will have improved cervical rotation and sidebending to 35 degrees bilaterally needed for driving   Time 4   Period Weeks   Status On-going           PT Long Term Goals - 11/18/16 1791      PT LONG TERM GOAL #1   Title The patient will be independent in safe self progression of HEP for further improvements in ROM and strength     Time 6   Period Weeks   Status On-going     PT LONG TERM GOAL #2   Title The patient will report a 75% improvement in neck and back pain with usual ADLS and work duties   Baseline 40% reported   Time 6   Period Weeks   Status On-going     PT LONG TERM GOAL #3   Title improve cervical A/ROM rotation to the Rt to > or = to  80 degrees to improve safety with driving     PT LONG TERM GOAL #4   Title The patient will have improved FOTO functional outcome score to 56% to 43% indicating improved function with less pain   Baseline 50% limitation   Time 6   Period Weeks   Status On-going     PT LONG TERM GOAL #5   Title The patient will be able to sit for 10-15 minutes with minimal increase in pain   Time 6   Period Weeks   Status On-going               Plan - 11/18/16 1018    Clinical Impression Statement Pt with reduced headache pain since last session.  Improved cervical A/ROM today into rotation.  Pt with reduced pain and improved mobility after traction today.  Pt tolerated increased weights with 3 way raises today. Pt with chronic pain and functional weakness and will benefit from skilled PT for strength, flexibility and manual therapy.     Rehab Potential Good   Clinical Impairments Affecting Rehab Potential Type 1 diabetes;  bilateral neuropathies; fall risk;  going to [place] 6/22 to 7/8   PT Frequency 2x / week   PT Duration 6 weeks   PT Treatment/Interventions ADLs/Self Care Home Management;Cryotherapy;Electrical Stimulation;Moist Heat;Traction;Ultrasound;Neuromuscular re-education;Therapeutic exercise;Therapeutic activities;Manual techniques;Dry needling;Taping   PT Next Visit Plan strength, flexibility, manual, modalities as needed. Assess response to traction.    Consulted and Agree with Plan of Care Patient      Patient will benefit from skilled therapeutic intervention in order to improve the following deficits and impairments:  Pain, Decreased activity tolerance, Decreased range of motion, Difficulty walking, Decreased strength  Visit Diagnosis: Chronic bilateral low back pain, with sciatica presence unspecified  Muscle weakness (generalized)  Cervicalgia     Problem List Patient Active Problem List   Diagnosis Date Noted  . Aspirin allergy 05/23/2016  . Familial  hyperlipidemia 05/23/2016  . Chest pain with moderate risk of acute coronary syndrome 05/23/2016  . Polyneuropathy in other diseases classified elsewhere ([place]) 01/06/2014  . Muscle cramps  11/11/2013  . Abnormality of gait 11/11/2013  . Type 1 diabetes mellitus with complications ([place]) 33/29/5188  . Essential hypertension 09/27/2008  . MITRAL VALVE PROLAPSE 09/27/2008  . MELANOSIS COLI 09/27/2008  . DEPRESSION, HX OF 09/27/2008   Sigurd Sos, PT 11/18/16 10:32 AM  [facility] [address], [place], [place], [address] Phone: [phone]   Fax:  [phone]  Name: JEANNI ALLSHOUSE MRN: [id number] Date of Birth: 05-18-1955

## 2016-11-19 ENCOUNTER — Ambulatory Visit (HOSPITAL_BASED_OUTPATIENT_CLINIC_OR_DEPARTMENT_OTHER): Payer: BLUE CROSS/BLUE SHIELD | Admitting: Genetics

## 2016-11-19 ENCOUNTER — Other Ambulatory Visit: Payer: BLUE CROSS/BLUE SHIELD

## 2016-11-19 DIAGNOSIS — Z800 Family history of malignant neoplasm of digestive organs: Secondary | ICD-10-CM | POA: Diagnosis not present

## 2016-11-19 DIAGNOSIS — Z7183 Encounter for nonprocreative genetic counseling: Secondary | ICD-10-CM

## 2016-11-19 DIAGNOSIS — Z808 Family history of malignant neoplasm of other organs or systems: Secondary | ICD-10-CM

## 2016-11-19 DIAGNOSIS — C4491 Basal cell carcinoma of skin, unspecified: Secondary | ICD-10-CM

## 2016-11-19 DIAGNOSIS — Z803 Family history of malignant neoplasm of breast: Secondary | ICD-10-CM | POA: Diagnosis not present

## 2016-11-20 ENCOUNTER — Encounter: Payer: Self-pay | Admitting: Genetics

## 2016-11-20 DIAGNOSIS — Z800 Family history of malignant neoplasm of digestive organs: Secondary | ICD-10-CM | POA: Insufficient documentation

## 2016-11-20 DIAGNOSIS — Z803 Family history of malignant neoplasm of breast: Secondary | ICD-10-CM | POA: Insufficient documentation

## 2016-11-20 DIAGNOSIS — Z808 Family history of malignant neoplasm of other organs or systems: Secondary | ICD-10-CM | POA: Insufficient documentation

## 2016-11-20 DIAGNOSIS — C4491 Basal cell carcinoma of skin, unspecified: Secondary | ICD-10-CM | POA: Insufficient documentation

## 2016-11-20 NOTE — Progress Notes (Signed)
REFERRING PROVIDER: Arvella Nigh, MD [address] [place], [place] [address]  PRIMARY PROVIDER:  Tisovec, Fransico Him, MD  PRIMARY REASON FOR VISIT:  1. Basal cell carcinoma, unspecified site   2. Family history of colon cancer   3. Family history of stomach cancer   4. Family history of breast cancer   5. Family history of melanoma     HISTORY OF PRESENT ILLNESS:   Ms. Dawn Lewis, a 61 y.o. female, was seen for a [place] cancer genetics consultation at the request of Dr. Radene Knee due to a family history of cancer.  Dawn Lewis presents to clinic today to discuss the possibility of a hereditary predisposition to cancer, genetic testing, and to further clarify her future cancer risks, as well as potential cancer risks for family members.    Dawn Lewis is a 61 y.o. female who had a Left breast cyst identified in 2017.  This was aspirated and fluid was clear and reportedly benign. She has had 2-3 Basal Cell Carcinomas removed starting in her late 72's.    HORMONAL RISK FACTORS:  Menarche was at age 46 First live birth at age N/A.  OCP use for approximately <1 years.  Ovaries intact: yes.  Hysterectomy: yes, partial hysterectomy @ 47.  Menopausal status: postmenopausal. Started '@46'  HRT use: <1 year. Has also taken progesterone for clotting years. Colonoscopy: yes; normal. Every 5 years Mammogram within the last year: yes. Number of breast biopsies: aspiration of cyst. Up to date with pelvic exams:  In 1990 had abnormal cells removed.  Past Medical History:  Diagnosis Date  . Abnormality of gait 11/11/2013  . Basal cell carcinoma (BCC)    2-3  . Family history of breast cancer   . Family history of colon cancer   . Family history of melanoma   . Family history of stomach cancer   . Hiatal hernia   . Hypercholesterolemia   . Muscle cramps 11/11/2013  . Polyneuropathy in other diseases classified elsewhere ([place]) 01/06/2014  . Type 1 diabetes [facility])     Past Surgical  History:  Procedure Laterality Date  . APPENDECTOMY    . CHOLECYSTECTOMY    . KNEE ARTHROSCOPY     left meniscus repair  . MANDIBLE SURGERY     extension  . PARTIAL HYSTERECTOMY    . tail bone repair     Coccyx fracture, subsequent resection  . TONSILLECTOMY      Social History   Social History  . Marital status: Married    Spouse name: Dawn Lewis  . Number of children: 3  . Years of education: MA   Occupational History  . Housewife    Social History Main Topics  . Smoking status: Never Smoker  . Smokeless tobacco: Never Used  . Alcohol use No  . Drug use: No  . Sexual activity: Not Asked   Other Topics Concern  . None   Social History Narrative   Patient lives at home with her husband (Dawn Lewis) and two of her children.   Patient is a care giver.    Arboriculturist .   Right handed.   Caffeine two sodas daily.     FAMILY HISTORY:  We obtained a detailed, 4-generation family history.  Significant diagnoses are listed below: Family History  Problem Relation Age of Onset  . Stroke Mother   . Breast cancer Mother        dx in 26's  . Thyroid cancer Mother  dx in 19's  . Colon cancer Father        dx in 57's. had part of colon removed and had many polyp after cancer dx.   . Colon cancer Maternal Grandfather        dx 50's/60's.  . Cancer Paternal Grandmother        dx. 21's. stomach/colon cancer- exact origin unk, died in her 34's  . Colon cancer Paternal Grandfather        dx 64's-60's, died in his 71's  . Esophageal cancer Maternal Uncle        dx 80's. hx smoking (maybe)  . Melanoma Maternal Uncle        dx 70's multiple melanomas and BCC's  . Breast cancer Cousin   . Melanoma Maternal Uncle        dx 70's  . Basal cell carcinoma Cousin        several, is now in 36's  . Stomach cancer Other        dx and died in 67's/60's  . Cancer Other        abdominal/stomach cancer, age dx unk  . Colon cancer Other 64  . Breast cancer Other        dx  70's  . Breast cancer Other        dx. >60  . Rectal cancer Neg Hx    Dawn Lewis' has adopted children, but no biological children.  She has a 49 year-old brother who has never had a colonoscopy, but no history of cancer.  He has no children.   Dawn Lewis' father was diagnosed with colon cancer in his 13's.  He had part of his colon removed and after this diagnosis had many polyps.  He died at 53.  Dawn Lewis has 4 paternal uncles and 1 paternal aunt listed below: -1 paternal uncle died in his 64's/80's and had no children or any history of cancer. -1 paternal uncle died in his 77's/80's and had 2 sons who are in their 87's-60's now. No history of cancer.  -1 paternal uncle died in his 56's/80's and had several children who are in their 97's and 23's.  No history of cancer.  -1 paternal uncle died in his 50's/80's and had several children who are in their 65's and 11's.  No history of cancer.  -1 paternal aunt died in her 1's/80's and had no children or any history of cancer.  Dawn Lewis' paternal grandfather had colon cancer in his 37's/60's and died in his 23's.  No other information is known about his side of the family.   Dawn Lewis' paternal grandmother was diagnosed with cancer in her abdomen.  The patient thinks it was colon or stomach in origin and was diagnosed in her 71's. She died from this cancer in her 48's.  No other information is known about her side of the family.    Dawn Lewis' mother was diagnosed with breast cancer in her 68's. She was later diagnosed with Thyroid cancer in her 63's.  She had some lumps and had her uterus and 1 ovary removed- the patient reports her mother was told "they didn't like how her ovary looked."  Dawn Lewis has 3 maternal uncles listed below: -1 maternal uncle was diagnosed with  Melanoma in his 68's and is now 34.  He had 2 sons who are in their 38's and a daughter who is 45. This daughter (patient's cousin) was diagnosed with breast cancer in her  14's.  This cousin has several children in their late 20's and 30's with no history of cancer.  -1 maternal uncle had multiple melanomas and basal cell carcinomas in his 77's.  He was diagnosed with esophogeal cancer at 32 and died shortly after.  He maybe had a history of smoking.  This uncle had 2 daughters who are in their 3's.  One daughter (pateint's cousin) has had many Basal cell carcinomas.  Ms. Longie' maternal grandfather was diagnosed with colon cancer in his 30's or 59's.  He later died from lung cancer in his 54's/70's.  He had 2 nieces who had cancer: 1 niece was diagnosed with breast cancer >60, the other niece had colon cancer at 69 and breast cancer in her 28's.  Ms. Copland' maternal grandmother died at 61 and had no history of cancer.  She had a sister who died from stomach cancer in her 26's or 13's.  This maternal grandmother's mother had an abdominal/stomach cancer.  The age of diagnosis is unknown.   Ms. Dennen is unaware of previous family history of genetic testing for hereditary cancer risks. Patient's maternal ancestors are of [place] and [place] descent, and paternal ancestors are of [place] descent. There is no reported Ashkenazi Jewish ancestry. There is no known consanguinity.  GENETIC COUNSELING ASSESSMENT: KIMARI LIENHARD is a 61 y.o. female with a family history which is somewhat suggestive of a Hereditary Cancer Predisposition Syndrome. We, therefore, discussed and recommended the following at today's visit.   DISCUSSION: We reviewed the characteristics, features and inheritance patterns of hereditary cancer syndromes. We also discussed genetic testing, including the appropriate family members to test, the process of testing, insurance coverage and turn-around-time for results. We discussed the implications of a negative, positive and/or variant of uncertain significant result. We recommended Ms. Clingerman pursue genetic testing for the [employer]. The  Multi-Cancer Panel offered by [facility] includes sequencing and/or deletion duplication testing of the following 83 genes: ALK, APC, ATM, AXIN2,BAP1,  BARD1, BLM, BMPR1A, BRCA1, BRCA2, BRIP1, CASR, CDC73, CDH1, CDK4, CDKN1B, CDKN1C, CDKN2A (p14ARF), CDKN2A (p16INK4a), CEBPA, CHEK2, CTNNA1, DICER1, DIS3L2, EGFR (c.2369C>T, p.Thr790Met variant only), EPCAM (Deletion/duplication testing only), FH, FLCN, GATA2, GPC3, GREM1 (Promoter region deletion/duplication testing only), HOXB13 (c.251G>A, p.Gly84Glu), HRAS, KIT, MAX, MEN1, MET, MITF (c.952G>A, p.Glu318Lys variant only), MLH1, MSH2, MSH3, MSH6, MUTYH, NBN, NF1, NF2, NTHL1, PALB2, PDGFRA, PHOX2B, PMS2, POLD1, POLE, POT1, PRKAR1A, PTCH1, PTEN, RAD50, RAD51C, RAD51D, RB1, RECQL4, RET, RUNX1, SDHAF2, SDHA (sequence changes only), SDHB, SDHC, SDHD, SMAD4, SMARCA4, SMARCB1, SMARCE1, STK11, SUFU, TERC, TERT, TMEM127, TP53, TSC1, TSC2, VHL, WRN and WT1.   We discussed that only 5-10% of cancers are associated with a Hereditary Cancer Predisposition Syndrome.  The most common hereditary cancer syndrome associated with colon cancer is Lynch Syndrome.  Lynch Syndrome is caused by mutations in the genes: MLH1, MSH2, MSH6, PMS2 and EPCAM.  This syndrome increases the risk for colon, uterine, ovarian and stomach cancers, as well as others.   I explained that there are other genes that are associated with polyposis, or developing many colon polyps.  There are also many other genes associated with breast cancer, melanoma, and many other types of cancer.    We discussed that if she is found to have a mutation in one of these genes, it may impact future medical management recommendations such as increased cancer screenings and consideration of risk reducing surgeries.  A positive result could also have implications for the patient's family members.  A Negative  result would mean we were unable to identify a hereditary component to her cancer, but does not rule out the possibility  of a hereditary basis for her cancer.  There could be mutations that are undetectable by current technology, or in genes not yet tested or identified to increase cancer risk.  In this case, she should follow screening recommendations based on personal and family history and as recommended by her physicians.    We discussed the potential to find a Variant of Uncertain Significance or VUS.  These are variants that have not yet been identified as pathogenic or benign, and it is unknown if this variant is associated with increased cancer risk or if this is a normal finding.  Most VUS's are reclassified to benign or likely benign.   It should not be used to make medical management decisions. With time, we suspect the lab will determine the significance of any VUS's identified if any.   Based on Ms. Fetherolf's family history of cancer, she meets medical criteria for genetic testing. Despite that she meets criteria, she may still have an out of pocket cost. We discussed that if her out of pocket cost for testing is over $100, the laboratory will call and confirm whether she wants to proceed with testing.  If the out of pocket cost of testing is less than $100 she will be billed by the genetic testing laboratory.    PLAN: After considering the risks, benefits, and limitations, Ms. Landing  provided informed consent to pursue genetic testing and the blood sample was sent to [facility] for analysis of the Multi-Cancer Panel. Results should be available within approximately 2-3 weeks' time, at which point they will be disclosed by telephone to Ms. Depriest, as will any additional recommendations warranted by these results. Ms. Butch will receive a summary of her genetic counseling visit and a copy of her results once available. This information will also be available in Epic. We encouraged Ms. Olvey to remain in contact with cancer genetics annually so that we can continuously update the family history and inform  her of any changes in cancer genetics and testing that may be of benefit for her family. Ms. Ivancic questions were answered to her satisfaction today. Our contact information was provided should additional questions or concerns arise.  Based on Ms. Prusinski's family history, we recommended her brother consider genetic counseling and testing. Ms. Jerkins will let us know if we can be of any assistance in coordinating genetic counseling and/or testing for this family member.   Lastly, we encouraged Ms. Alia to remain in contact with cancer genetics annually so that we can continuously update the family history and inform her of any changes in cancer genetics and testing that may be of benefit for this family.   Ms.  Durkin questions were answered to her satisfaction today. Our contact information was provided should additional questions or concerns arise. Thank you for the referral and allowing [place] to share in the care of your patient.   Tana Felts, MS Genetic Counselor [email or web address] phone: [phone]  The patient was seen for a total of 40 minutes in face-to-face genetic counseling.

## 2016-11-25 ENCOUNTER — Ambulatory Visit: Payer: BLUE CROSS/BLUE SHIELD

## 2016-11-25 DIAGNOSIS — G8929 Other chronic pain: Secondary | ICD-10-CM

## 2016-11-25 DIAGNOSIS — M545 Low back pain: Secondary | ICD-10-CM | POA: Diagnosis not present

## 2016-11-25 DIAGNOSIS — M542 Cervicalgia: Secondary | ICD-10-CM | POA: Diagnosis not present

## 2016-11-25 DIAGNOSIS — M6281 Muscle weakness (generalized): Secondary | ICD-10-CM

## 2016-11-25 NOTE — Therapy (Signed)
[facility] [address], [place], [place], [address] Phone: [phone]   Fax:  [phone]  Physical Therapy Treatment  Patient Details  Name: Dawn Lewis MRN: [id number] Date of Birth: 31-Jan-1956 Referring Provider: Dr. Melina Schools  Encounter Date: 11/25/2016      PT End of Session - 11/25/16 1440    Visit Number 16   Date for PT Re-Evaluation 12/09/16   PT Start Time 1401   PT Stop Time 1458   PT Time Calculation (min) 57 min   Activity Tolerance Patient tolerated treatment well   Behavior During Therapy [facility] for tasks assessed/performed      Past Medical History:  Diagnosis Date  . Abnormality of gait 11/11/2013  . Basal cell carcinoma (BCC)    2-3  . Family history of breast cancer   . Family history of colon cancer   . Family history of melanoma   . Family history of stomach cancer   . Hiatal hernia   . Hypercholesterolemia   . Muscle cramps 11/11/2013  . Polyneuropathy in other diseases classified elsewhere ([place]) 01/06/2014  . Type 1 diabetes [facility])     Past Surgical History:  Procedure Laterality Date  . APPENDECTOMY    . CHOLECYSTECTOMY    . KNEE ARTHROSCOPY     left meniscus repair  . MANDIBLE SURGERY     extension  . PARTIAL HYSTERECTOMY    . tail bone repair     Coccyx fracture, subsequent resection  . TONSILLECTOMY      There were no vitals filed for this visit.      Subjective Assessment - 11/25/16 1407    Subjective I am having less pain overall.     Currently in Pain? Yes   Pain Score 5    Pain Location Back   Pain Orientation Right;Left;Lower   Pain Descriptors / Indicators Aching;Tightness   Pain Type Chronic pain   Pain Onset More than a month ago   Pain Frequency Constant   Aggravating Factors  sitting, movement   Pain Relieving Factors change of position   Pain Score 5   Pain Location Neck   Pain Orientation Right;Left   Pain Descriptors / Indicators  Tightness   Pain Type Chronic pain   Pain Onset More than a month ago   Pain Frequency Constant   Aggravating Factors  movement   Pain Relieving Factors kinesiotape, stretching                         [facility] Adult PT Treatment/Exercise - 11/25/16 0001      Neck Exercises: Machines for Strengthening   UBE (Upper Arm Bike) --     Neck Exercises: Seated   Shoulder Flexion Both;20 reps;Weights  seated on green ball   Shoulder Flexion Weights (lbs) 3   Shoulder ABduction Both;20 reps;Weights  seated on green ball   Shoulder Abduction Weights (lbs) 3   Shoulder Abduction Limitations scaption 3# 2x10     Lumbar Exercises: Standing   Row Strengthening;Power tower;Both  3x10   Row Limitations 25#   Shoulder Extension Strengthening;Power Tower;Both  20# 3x10     Lumbar Exercises: Seated   Sit to Stand Limitations seated thoracolumbar rotation 3x20 seconds     Knee/Hip Exercises: Aerobic   Nustep Level 2 x 8 minutes     Modalities   Modalities Traction     Traction   Type of Traction Cervical   Min (  lbs) 5   Max (lbs) 15   Hold Time 60   Rest Time 20   Time 15     Manual Therapy   Kinesiotex Inhibit Muscle     Kinesiotix   Inhibit Muscle  Star over low back, I strips over rhomboids                   PT Short Term Goals - 11/25/16 1414      PT SHORT TERM GOAL #3   Title The patient will have improved cervical rotation and sidebending to 35 degrees bilaterally needed for driving   Time 4   Period Weeks   Status On-going           PT Long Term Goals - 11/25/16 1414      PT LONG TERM GOAL #2   Title The patient will report a 75% improvement in neck and back pain with usual ADLS and work duties   Baseline 40% reported   Time 6   Period Weeks   Status On-going     PT LONG TERM GOAL #4   Title The patient will have improved FOTO functional outcome score to 56% to 43% indicating improved function with less pain   Baseline 50%  limitation   Time 6   Period Weeks   Status On-going               Plan - 11/25/16 1416    Clinical Impression Statement Pt reports 5/10 neck and low back pain today.  Pt reports that she feels over all better and reports 40% improvement.  Pt is responding well to traction.  Pt tolerated strengthing of scapular muscles with weight today.  Pt with chronic pain and limited A/ROM and will continue to benefit from skilled PT for strength, flexibility and traction.     Clinical Impairments Affecting Rehab Potential Type 1 diabetes;  bilateral neuropathies; fall risk;  going to [place] 6/22 to 7/8   PT Frequency 2x / week   PT Duration 6 weeks   PT Treatment/Interventions ADLs/Self Care Home Management;Cryotherapy;Electrical Stimulation;Moist Heat;Traction;Ultrasound;Neuromuscular re-education;Therapeutic exercise;Therapeutic activities;Manual techniques;Dry needling;Taping   PT Next Visit Plan strength, flexibility, manual, modalities as needed. continue traction if helpful.  Kinesiotape.     Consulted and Agree with Plan of Care Patient      Patient will benefit from skilled therapeutic intervention in order to improve the following deficits and impairments:  Pain, Decreased activity tolerance, Decreased range of motion, Difficulty walking, Decreased strength  Visit Diagnosis: Chronic bilateral low back pain, with sciatica presence unspecified  Muscle weakness (generalized)  Cervicalgia     Problem List Patient Active Problem List   Diagnosis Date Noted  . Basal cell carcinoma (BCC)   . Family history of colon cancer   . Family history of stomach cancer   . Family history of breast cancer   . Family history of melanoma   . Aspirin allergy 05/23/2016  . Familial hyperlipidemia 05/23/2016  . Chest pain with moderate risk of acute coronary syndrome 05/23/2016  . Polyneuropathy in other diseases classified elsewhere ([place]) 01/06/2014  . Muscle cramps 11/11/2013  . Abnormality  of gait 11/11/2013  . Type 1 diabetes mellitus with complications ([place]) 40/01/2724  . Essential hypertension 09/27/2008  . MITRAL VALVE PROLAPSE 09/27/2008  . MELANOSIS COLI 09/27/2008  . DEPRESSION, HX OF 09/27/2008     Sigurd Sos, PT 11/25/16 2:42 PM  [facility] [address], [address]  [place], [place], [address] Phone: [phone]   Fax:  [phone]  Name: Dawn Lewis MRN: [id number] Date of Birth: 16-Oct-1955

## 2016-11-26 DIAGNOSIS — E559 Vitamin D deficiency, unspecified: Secondary | ICD-10-CM | POA: Diagnosis not present

## 2016-11-27 ENCOUNTER — Ambulatory Visit: Payer: BLUE CROSS/BLUE SHIELD

## 2016-11-27 DIAGNOSIS — M6281 Muscle weakness (generalized): Secondary | ICD-10-CM

## 2016-11-27 DIAGNOSIS — G8929 Other chronic pain: Secondary | ICD-10-CM

## 2016-11-27 DIAGNOSIS — M545 Low back pain: Principal | ICD-10-CM

## 2016-11-27 DIAGNOSIS — K9041 Non-celiac gluten sensitivity: Secondary | ICD-10-CM | POA: Diagnosis not present

## 2016-11-27 DIAGNOSIS — M542 Cervicalgia: Secondary | ICD-10-CM

## 2016-11-27 NOTE — Therapy (Signed)
[facility] [address], [place], [place], [address] Phone: [phone]   Fax:  [phone]  Physical Therapy Treatment  Patient Details  Name: Dawn Lewis MRN: [id number] Date of Birth: 07/25/1955 Referring Provider: Dr. Melina Schools  Encounter Date: 11/27/2016      PT End of Session - 11/27/16 1257    Visit Number 17   Date for PT Re-Evaluation 12/09/16   PT Start Time 1232   PT Stop Time 1318   PT Time Calculation (min) 46 min   Activity Tolerance Patient tolerated treatment well   Behavior During Therapy [facility] for tasks assessed/performed      Past Medical History:  Diagnosis Date  . Abnormality of gait 11/11/2013  . Basal cell carcinoma (BCC)    2-3  . Family history of breast cancer   . Family history of colon cancer   . Family history of melanoma   . Family history of stomach cancer   . Hiatal hernia   . Hypercholesterolemia   . Muscle cramps 11/11/2013  . Polyneuropathy in other diseases classified elsewhere ([place]) 01/06/2014  . Type 1 diabetes [facility])     Past Surgical History:  Procedure Laterality Date  . APPENDECTOMY    . CHOLECYSTECTOMY    . KNEE ARTHROSCOPY     left meniscus repair  . MANDIBLE SURGERY     extension  . PARTIAL HYSTERECTOMY    . tail bone repair     Coccyx fracture, subsequent resection  . TONSILLECTOMY      There were no vitals filed for this visit.      Subjective Assessment - 11/27/16 1237    Subjective Traction is helping.  I feel tight.     Patient Stated Goals Gain strength so I can walk again and increase activity;  less pain   Currently in Pain? Yes   Pain Score 5    Pain Location Back   Pain Orientation Left;Right;Lower   Pain Score 5   Pain Location Neck   Pain Orientation Right;Left                         [facility] Adult PT Treatment/Exercise - 11/27/16 0001      Neck Exercises: Machines for Strengthening   UBE (Upper Arm Bike)  Level 1 x 8 minutes (4/4)     Neck Exercises: Seated   Shoulder Flexion Both;20 reps;Weights  seated on green ball   Shoulder Flexion Weights (lbs) 3   Shoulder ABduction Both;20 reps;Weights  seated on green ball   Shoulder Abduction Weights (lbs) 3   Shoulder Abduction Limitations scaption 3# 2x10     Lumbar Exercises: Standing   Row Strengthening;Power tower;Both  3x10   Row Limitations 25#   Shoulder Extension Strengthening;Power Tower;Both  20# 3x10     Lumbar Exercises: Seated   Sit to Stand Limitations seated thoracolumbar rotation 3x20 seconds     Modalities   Modalities Traction     Traction   Type of Traction Cervical   Min (lbs) 5   Max (lbs) 15   Hold Time 60   Rest Time 20   Time 15     Manual Therapy   Kinesiotex --     Kinesiotix   Inhibit Muscle  --                  PT Short Term Goals - 11/25/16 1414      PT SHORT  TERM GOAL #3   Title The patient will have improved cervical rotation and sidebending to 35 degrees bilaterally needed for driving   Time 4   Period Weeks   Status On-going           PT Long Term Goals - 11/25/16 1414      PT LONG TERM GOAL #2   Title The patient will report a 75% improvement in neck and back pain with usual ADLS and work duties   Baseline 40% reported   Time 6   Period Weeks   Status On-going     PT LONG TERM GOAL #4   Title The patient will have improved FOTO functional outcome score to 56% to 43% indicating improved function with less pain   Baseline 50% limitation   Time 6   Period Weeks   Status On-going               Plan - 11/27/16 1254    Clinical Impression Statement Pt reports 40% overall improvement in lumbar and cervical symptoms since the start of care.  Pt is responding well to traction.  Pt tolerated addition of scapular strength exercises this weel.  Pt with chronic pain and limited A/ROM and will continue to benefit from skilled PT for strength, flexibility and  traction.     Rehab Potential Good   Clinical Impairments Affecting Rehab Potential Type 1 diabetes;  bilateral neuropathies; fall risk;  going to [place] 6/22 to 7/8   PT Frequency 2x / week   PT Duration 6 weeks   PT Treatment/Interventions ADLs/Self Care Home Management;Cryotherapy;Electrical Stimulation;Moist Heat;Traction;Ultrasound;Neuromuscular re-education;Therapeutic exercise;Therapeutic activities;Manual techniques;Dry needling;Taping   PT Next Visit Plan strength, flexibility, manual, modalities as needed. continue traction if helpful.  Kinesiotape.     Consulted and Agree with Plan of Care Patient      Patient will benefit from skilled therapeutic intervention in order to improve the following deficits and impairments:  Pain, Decreased activity tolerance, Decreased range of motion, Difficulty walking, Decreased strength  Visit Diagnosis: Chronic bilateral low back pain, with sciatica presence unspecified  Muscle weakness (generalized)  Cervicalgia     Problem List Patient Active Problem List   Diagnosis Date Noted  . Basal cell carcinoma (BCC)   . Family history of colon cancer   . Family history of stomach cancer   . Family history of breast cancer   . Family history of melanoma   . Aspirin allergy 05/23/2016  . Familial hyperlipidemia 05/23/2016  . Chest pain with moderate risk of acute coronary syndrome 05/23/2016  . Polyneuropathy in other diseases classified elsewhere ([place]) 01/06/2014  . Muscle cramps 11/11/2013  . Abnormality of gait 11/11/2013  . Type 1 diabetes mellitus with complications ([place]) 98/92/1194  . Essential hypertension 09/27/2008  . MITRAL VALVE PROLAPSE 09/27/2008  . MELANOSIS COLI 09/27/2008  . DEPRESSION, HX OF 09/27/2008    Sigurd Sos, PT 11/27/16 1:08 PM  [facility] [address], [place], [place], [address] Phone: [phone]   Fax:  [phone]  Name: Dawn Lewis MRN: [id number] Date of Birth: [birth year]

## 2016-11-28 DIAGNOSIS — E1140 Type 2 diabetes mellitus with diabetic neuropathy, unspecified: Secondary | ICD-10-CM | POA: Diagnosis not present

## 2016-11-28 DIAGNOSIS — Z794 Long term (current) use of insulin: Secondary | ICD-10-CM | POA: Diagnosis not present

## 2016-11-28 DIAGNOSIS — E1040 Type 1 diabetes mellitus with diabetic neuropathy, unspecified: Secondary | ICD-10-CM | POA: Diagnosis not present

## 2016-11-28 DIAGNOSIS — E7801 Familial hypercholesterolemia: Secondary | ICD-10-CM | POA: Diagnosis not present

## 2016-12-03 DIAGNOSIS — M7989 Other specified soft tissue disorders: Secondary | ICD-10-CM | POA: Diagnosis not present

## 2016-12-03 DIAGNOSIS — M0609 Rheumatoid arthritis without rheumatoid factor, multiple sites: Secondary | ICD-10-CM | POA: Diagnosis not present

## 2016-12-03 DIAGNOSIS — M2550 Pain in unspecified joint: Secondary | ICD-10-CM | POA: Diagnosis not present

## 2016-12-03 DIAGNOSIS — R5382 Chronic fatigue, unspecified: Secondary | ICD-10-CM | POA: Diagnosis not present

## 2016-12-04 ENCOUNTER — Ambulatory Visit: Payer: BLUE CROSS/BLUE SHIELD

## 2016-12-04 DIAGNOSIS — G8929 Other chronic pain: Secondary | ICD-10-CM | POA: Diagnosis not present

## 2016-12-04 DIAGNOSIS — M6281 Muscle weakness (generalized): Secondary | ICD-10-CM | POA: Diagnosis not present

## 2016-12-04 DIAGNOSIS — M542 Cervicalgia: Secondary | ICD-10-CM | POA: Diagnosis not present

## 2016-12-04 DIAGNOSIS — M545 Low back pain: Secondary | ICD-10-CM | POA: Diagnosis not present

## 2016-12-04 NOTE — Therapy (Signed)
[facility] [address] [place], [place], [address] Phone: [phone]   Fax:  [phone]  Physical Therapy Treatment  Patient Details  Name: Dawn Lewis MRN: [id number] Date of Birth: 1955/09/20 Referring Provider: Dr. Melina Schools  Encounter Date: 12/04/2016      PT End of Session - 12/04/16 1134    Visit Number 18   Date for PT Re-Evaluation 12/09/16   PT Start Time 1100   PT Stop Time 1155   PT Time Calculation (min) 55 min   Activity Tolerance Patient tolerated treatment well   Behavior During Therapy [facility] for tasks assessed/performed      Past Medical History:  Diagnosis Date  . Abnormality of gait 11/11/2013  . Basal cell carcinoma (BCC)    2-3  . Family history of breast cancer   . Family history of colon cancer   . Family history of melanoma   . Family history of stomach cancer   . Hiatal hernia   . Hypercholesterolemia   . Muscle cramps 11/11/2013  . Polyneuropathy in other diseases classified elsewhere ([place]) 01/06/2014  . Type 1 diabetes [facility])     Past Surgical History:  Procedure Laterality Date  . APPENDECTOMY    . CHOLECYSTECTOMY    . KNEE ARTHROSCOPY     left meniscus repair  . MANDIBLE SURGERY     extension  . PARTIAL HYSTERECTOMY    . tail bone repair     Coccyx fracture, subsequent resection  . TONSILLECTOMY      There were no vitals filed for this visit.      Subjective Assessment - 12/04/16 1106    Subjective I think traction is helpful.   Currently in Pain? Yes   Pain Score 7    Pain Location Back   Pain Orientation Right;Left;Lower   Pain Descriptors / Indicators Aching;Tightness   Pain Type Chronic pain   Pain Onset More than a month ago   Pain Frequency Constant   Aggravating Factors  sitting, movement   Pain Relieving Factors change of position   Pain Score 6   Pain Location Neck   Pain Orientation Right;Left   Pain Descriptors / Indicators Tightness    Pain Type Chronic pain   Pain Onset More than a month ago   Pain Frequency Constant   Aggravating Factors  movement   Pain Relieving Factors kinesiotape, stretching                         [facility] Adult PT Treatment/Exercise - 12/04/16 0001      Neck Exercises: Seated   Shoulder Flexion Both;20 reps;Weights  seated on green ball   Shoulder Flexion Weights (lbs) 3   Shoulder ABduction Both;20 reps;Weights  seated on green ball   Shoulder Abduction Weights (lbs) 3   Shoulder Abduction Limitations shoulder abduction 3# 2x10     Lumbar Exercises: Standing   Row Strengthening;Power tower;Both  3x10   Row Limitations 25#   Shoulder Extension Strengthening;Power Tower;Both  20# 3x10     Lumbar Exercises: Seated   Sit to Stand Limitations seated thoracolumbar rotation 3x20 seconds     Modalities   Modalities Traction     Traction   Type of Traction Cervical   Min (lbs) 5   Max (lbs) 15   Hold Time 60   Rest Time 20   Time 15     Manual Therapy   Kinesiotex Inhibit Muscle  Kinesiotix   Inhibit Muscle  Star over low back, I strips over rhomboids                   PT Short Term Goals - 11/25/16 1414      PT SHORT TERM GOAL #3   Title The patient will have improved cervical rotation and sidebending to 35 degrees bilaterally needed for driving   Time 4   Period Weeks   Status On-going           PT Long Term Goals - 12/04/16 1114      PT LONG TERM GOAL #1   Title The patient will be independent in safe self progression of HEP for further improvements in ROM and strength     Time 6   Period Weeks   Status On-going     PT LONG TERM GOAL #2   Title The patient will report a 75% improvement in neck and back pain with usual ADLS and work duties   Baseline 40% reported   Time 6   Period Weeks   Status On-going               Plan - 12/04/16 1115    Clinical Impression Statement Pt reports 40% overall improvement in lumbar  and cervical symptoms since the start of care.  Pt able to tolerate increased weights today.  Pt continues to do well with traction.  Pt will benefit from skilled PT for strength, flexibility and traction.     Rehab Potential Good   Clinical Impairments Affecting Rehab Potential Type 1 diabetes;  bilateral neuropathies; fall risk;  going to [place] 6/22 to 7/8   PT Frequency 2x / week   PT Duration 6 weeks   PT Treatment/Interventions ADLs/Self Care Home Management;Cryotherapy;Electrical Stimulation;Moist Heat;Traction;Ultrasound;Neuromuscular re-education;Therapeutic exercise;Therapeutic activities;Manual techniques;Dry needling;Taping   PT Next Visit Plan strength, flexibility, manual, modalities as needed. continue traction if helpful.  Kinesiotape.  Try elliptical next session   Consulted and Agree with Plan of Care Patient      Patient will benefit from skilled therapeutic intervention in order to improve the following deficits and impairments:  Pain, Decreased activity tolerance, Decreased range of motion, Difficulty walking, Decreased strength  Visit Diagnosis: Chronic bilateral low back pain, with sciatica presence unspecified  Muscle weakness (generalized)  Cervicalgia     Problem List Patient Active Problem List   Diagnosis Date Noted  . Basal cell carcinoma (BCC)   . Family history of colon cancer   . Family history of stomach cancer   . Family history of breast cancer   . Family history of melanoma   . Aspirin allergy 05/23/2016  . Familial hyperlipidemia 05/23/2016  . Chest pain with moderate risk of acute coronary syndrome 05/23/2016  . Polyneuropathy in other diseases classified elsewhere ([place]) 01/06/2014  . Muscle cramps 11/11/2013  . Abnormality of gait 11/11/2013  . Type 1 diabetes mellitus with complications ([place]) 62/37/6283  . Essential hypertension 09/27/2008  . MITRAL VALVE PROLAPSE 09/27/2008  . MELANOSIS COLI 09/27/2008  . DEPRESSION, HX OF 09/27/2008    Sigurd Sos, PT 12/04/16 11:43 AM  [facility] [address] [place], [place], [address] Phone: [phone]   Fax:  [phone]  Name: Dawn Lewis MRN: [id number] Date of Birth: 01/28/1956

## 2016-12-05 ENCOUNTER — Ambulatory Visit: Payer: BLUE CROSS/BLUE SHIELD

## 2016-12-05 DIAGNOSIS — M545 Low back pain: Principal | ICD-10-CM

## 2016-12-05 DIAGNOSIS — M6281 Muscle weakness (generalized): Secondary | ICD-10-CM

## 2016-12-05 DIAGNOSIS — G8929 Other chronic pain: Secondary | ICD-10-CM

## 2016-12-05 DIAGNOSIS — M542 Cervicalgia: Secondary | ICD-10-CM | POA: Diagnosis not present

## 2016-12-05 NOTE — Therapy (Signed)
[facility] [address], [place], [place], [address] Phone: [phone]   Fax:  [phone]  Physical Therapy Treatment  Patient Details  Name: Dawn Lewis MRN: [id number] Date of Birth: 1956/02/19 Referring Provider: Dr. Melina Schools  Encounter Date: 12/05/2016      PT End of Session - 12/05/16 1313    Visit Number 19   Date for PT Re-Evaluation 12/09/16   PT Start Time 1242  late   PT Stop Time 1324   PT Time Calculation (min) 42 min   Activity Tolerance Patient tolerated treatment well   Behavior During Therapy [facility] for tasks assessed/performed      Past Medical History:  Diagnosis Date  . Abnormality of gait 11/11/2013  . Basal cell carcinoma (BCC)    2-3  . Family history of breast cancer   . Family history of colon cancer   . Family history of melanoma   . Family history of stomach cancer   . Hiatal hernia   . Hypercholesterolemia   . Muscle cramps 11/11/2013  . Polyneuropathy in other diseases classified elsewhere ([place]) 01/06/2014  . Type 1 diabetes [facility])     Past Surgical History:  Procedure Laterality Date  . APPENDECTOMY    . CHOLECYSTECTOMY    . KNEE ARTHROSCOPY     left meniscus repair  . MANDIBLE SURGERY     extension  . PARTIAL HYSTERECTOMY    . tail bone repair     Coccyx fracture, subsequent resection  . TONSILLECTOMY      There were no vitals filed for this visit.      Subjective Assessment - 12/05/16 1242    Subjective I want to try the elliptical today.     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 Yes   Pain Score 7    Pain Location Back                          [facility] Adult PT Treatment/Exercise - 12/05/16 0001      Neck Exercises: Seated   Shoulder Flexion Both;20 reps;Weights  seated on green ball   Shoulder Flexion Weights (lbs) 3   Shoulder ABduction Both;20 reps;Weights  seated on green ball   Shoulder Abduction Weights (lbs) 3   Shoulder Abduction Limitations scaption 3# 2x10     Lumbar Exercises: Aerobic   Elliptical Level 3, ramp 5x8 minutes  verbal cues for core activation and posture     Lumbar Exercises: Standing   Row Strengthening;Power tower;Both  3x10   Row Limitations 30#   Shoulder Extension Strengthening;Power Tower;Both  30# 2x10     Lumbar Exercises: Seated   Sit to Stand Limitations seated thoracolumbar rotation 3x20 seconds     Traction   Type of Traction Cervical   Min (lbs) 5   Max (lbs) 15   Hold Time 60   Rest Time 20   Time 15     Neck Exercises: Stretches   Other Neck Stretches cervical A/ROM 3 ways x 20 second each  PT Short Term Goals - 11/25/16 1414      PT SHORT TERM GOAL #3   Title The patient will have improved cervical rotation and sidebending to 35 degrees bilaterally needed for driving   Time 4   Period Weeks   Status On-going           PT Long Term Goals - 12/04/16 1114      PT LONG TERM GOAL #1   Title The patient will be independent in safe self progression of HEP for further improvements in ROM and strength     Time 6   Period Weeks   Status On-going     PT LONG TERM GOAL #2   Title The patient will report a 75% improvement in neck and back pain with usual ADLS and work duties   Baseline 40% reported   Time 6   Period Weeks   Status On-going               Plan - 12/05/16 1243    Clinical Impression Statement Pt reports 40% overall improvement in lumbar and cervical symptoms since the start of care.  Pt tolerated the elliptical today without increased pain.  Pt continues to benefit from  traction.  Pt will benefit from skilled PT for strength, flexibility and traction.     Clinical Impairments Affecting Rehab Potential Type 1 diabetes;  bilateral neuropathies; fall risk;  going to [place] 6/22 to 7/8   PT Frequency 2x / week   PT Duration 6 weeks   PT Treatment/Interventions ADLs/Self Care Home Management;Cryotherapy;Electrical Stimulation;Moist Heat;Traction;Ultrasound;Neuromuscular re-education;Therapeutic exercise;Therapeutic activities;Manual techniques;Dry needling;Taping   PT Next Visit Plan strength, flexibility, manual, modalities as needed. continue traction if helpful.  Kinesiotape.  Try elliptical next session   Consulted and Agree with Plan of Care Patient      Patient will benefit from skilled therapeutic intervention in order to improve the following deficits and impairments:  Pain, Decreased activity tolerance, Decreased range of motion, Difficulty walking, Decreased strength  Visit Diagnosis: Chronic bilateral low back pain, with sciatica presence unspecified  Muscle weakness (generalized)  Cervicalgia     Problem List Patient Active Problem List   Diagnosis Date Noted  . Basal cell carcinoma (BCC)   . Family history of colon cancer   . Family history of stomach cancer   . Family history of breast cancer   . Family history of melanoma   . Aspirin allergy 05/23/2016  . Familial hyperlipidemia 05/23/2016  . Chest pain with moderate risk of acute coronary syndrome 05/23/2016  . Polyneuropathy in other diseases classified elsewhere ([place]) 01/06/2014  . Muscle cramps 11/11/2013  . Abnormality of gait 11/11/2013  . Type 1 diabetes mellitus with complications ([place]) 76/19/5093  . Essential hypertension 09/27/2008  . MITRAL VALVE PROLAPSE 09/27/2008  . MELANOSIS COLI 09/27/2008  . DEPRESSION, HX OF 09/27/2008    Sigurd Sos, PT 12/05/16 1:16 PM  [facility] [address], [place], [place], [address] Phone: [phone]   Fax:  [phone]  Name: Dawn Lewis MRN: [id number] Date of Birth: Nov 19, 1955

## 2016-12-09 ENCOUNTER — Ambulatory Visit: Payer: BLUE CROSS/BLUE SHIELD

## 2016-12-09 DIAGNOSIS — M6281 Muscle weakness (generalized): Secondary | ICD-10-CM

## 2016-12-09 DIAGNOSIS — M545 Low back pain: Principal | ICD-10-CM

## 2016-12-09 DIAGNOSIS — G8929 Other chronic pain: Secondary | ICD-10-CM | POA: Diagnosis not present

## 2016-12-09 DIAGNOSIS — M542 Cervicalgia: Secondary | ICD-10-CM | POA: Diagnosis not present

## 2016-12-09 NOTE — Patient Instructions (Addendum)
Arm / Leg Lift: Opposite (Prone)   Lift right leg and opposite arm ____ inches from floor, keeping knee locked. Repeat __10__ times per set. Do __1-2__ sets per session. Do __1__ sessions per day.   Straight Leg Raise (Prone)   Abdomen and head supported, keep left knee locked and raise leg at hip. Avoid arching low back. Repeat _10___ times per set. Do _1-2___ sets per session. Do _2___ sessions per day.      Straight Arm Lift: Prone   Arm straight out STRAIGHT Keeping thumb up, lift arm. Keep pelvis still. Do 10 __ times, 1-2 sets, __1_ times per day.   Bridge    Lie back, legs bent. Inhale, pressing hips up. Keeping ribs in, lengthen lower back. Exhale, rolling down along spine from top. Hold 5 seconds. Repeat _10___ times. Do _1-2___ sessions per day.  http://pm.exer.us/55   Copyright  VHI. All rights reserved.  [employer] [address] [place], [place] [address] Phone # [phone] Fax [phone]

## 2016-12-09 NOTE — Therapy (Signed)
[facility] [address] [place], [place], [address] Phone: [phone]   Fax:  [phone]  Physical Therapy Treatment  Patient Details  Name: Dawn Lewis MRN: [id number] Date of Birth: [birth year] Referring Provider: Dr. Melina Schools  Encounter Date: 12/09/2016      PT End of Session - 12/09/16 1139    Visit Number 20   PT Start Time 1102   PT Stop Time 1152   PT Time Calculation (min) 50 min   Activity Tolerance Patient tolerated treatment well   Behavior During Therapy [facility] for tasks assessed/performed      Past Medical History:  Diagnosis Date  . Abnormality of gait 11/11/2013  . Basal cell carcinoma (BCC)    2-3  . Family history of breast cancer   . Family history of colon cancer   . Family history of melanoma   . Family history of stomach cancer   . Hiatal hernia   . Hypercholesterolemia   . Muscle cramps 11/11/2013  . Polyneuropathy in other diseases classified elsewhere ([place]) 01/06/2014  . Type 1 diabetes [facility])     Past Surgical History:  Procedure Laterality Date  . APPENDECTOMY    . CHOLECYSTECTOMY    . KNEE ARTHROSCOPY     left meniscus repair  . MANDIBLE SURGERY     extension  . PARTIAL HYSTERECTOMY    . tail bone repair     Coccyx fracture, subsequent resection  . TONSILLECTOMY      There were no vitals filed for this visit.      Subjective Assessment - 12/09/16 1102    Subjective I am feeling good.     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 Yes   Pain Score 6    Pain Location Back   Pain Orientation Right;Left;Lower   Pain Descriptors / Indicators Aching;Tightness   Pain Type Chronic pain   Pain Onset More than a month ago   Aggravating Factors   siting, movement   Pain Relieving Factors change of position   Pain Score 6   Pain Location Neck   Pain Orientation Right;Left   Pain Descriptors / Indicators Tightness   Pain Type Chronic pain   Pain Onset More than a month ago   Pain Frequency Constant   Aggravating Factors  movement    Pain Relieving Factors kinesiotape, stretching            [facility] PT Assessment - 12/09/16 0001      Assessment   Medical Diagnosis neck and back pain     Home Environment   Living Environment Private residence   Living Arrangements Spouse/significant other     Observation/Other Assessments   Focus on Therapeutic Outcomes (FOTO)  47% limitation     AROM   Cervical - Right Side Bend 30   Cervical - Left Side Bend 25   Cervical - Right Rotation 60   Cervical - Left Rotation 75                     [facility] Adult PT Treatment/Exercise - 12/09/16 0001      Lumbar Exercises: Aerobic   Elliptical Level 4, ramp 5x8 minutes  verbal cues for core activation and posture  Lumbar Exercises: Seated   Sit to Stand Limitations seated thoracolumbar rotation 3x20 seconds     Lumbar Exercises: Supine   Bridge 10 reps;5 seconds     Lumbar Exercises: Prone   Single Arm Raise Right;Left;10 reps   Straight Leg Raise 10 reps   Opposite Arm/Leg Raise Left arm/Right leg;Right arm/Left leg;10 reps     Traction   Type of Traction Cervical   Min (lbs) 5   Max (lbs) 15   Hold Time 60   Rest Time 20   Time 15     Manual Therapy   Kinesiotex Inhibit Muscle     Kinesiotix   Inhibit Muscle  Star over low back, I strips over rhomboids      Neck Exercises: Stretches   Other Neck Stretches cervical A/ROM 3 ways x 20 second each                PT Education - 12/09/16 1127    Education provided Yes   Education Details bridge, prone arm/leg   Person(s) Educated Patient   Methods Explanation;Demonstration;Handout   Comprehension Returned demonstration;Verbalized understanding           PT Short Term Goals - 11/25/16 1414      PT SHORT TERM GOAL #3   Title The patient will have improved cervical rotation and sidebending to 35 degrees bilaterally needed for driving   Time 4   Period Weeks   Status On-going           PT Long Term Goals - 12/09/16 1106      PT LONG TERM GOAL #1   Title The patient will be independent in safe self progression of HEP for further improvements in ROM and strength     Status On-going     PT LONG TERM GOAL #2   Title The patient will report a 75% improvement in neck and back pain with usual ADLS and work duties   Baseline 65% improvement     PT LONG TERM GOAL #3   Title improve cervical A/ROM rotation to the Rt to > or = to 80 degrees to improve safety with driving   Baseline 60 Rt, 75 Lt   Status Partially Met     PT LONG TERM GOAL #4   Title The patient will have improved FOTO functional outcome score to 56% to 43% indicating improved function with less pain   Baseline 47%   Status Partially Met     PT LONG TERM GOAL #5   Title The patient will be able to sit for 10-15 minutes with minimal increase in pain   Baseline 20 minutes   Status Achieved               Plan - 12/09/16 1120    Clinical Impression Statement Pt reports 65% overall improvement in symptoms since the start of care.  PT advanced HEP for core and upper back strength today.  Cervical A/ROM is improved.  Pt is limited to sitting 20 minutes.  Pt will D/C to comprehensive HEP     PT Next Visit Plan D/C PT to HEP today.     Consulted and Agree with Plan of Care Patient      Patient will benefit from skilled therapeutic intervention in order to improve the following deficits and impairments:     Visit Diagnosis: Chronic bilateral low back pain, with sciatica presence unspecified  Muscle weakness (generalized)  Cervicalgia     Problem List Patient Active  Problem List   Diagnosis Date Noted  . Basal cell carcinoma (BCC)   . Family  history of colon cancer   . Family history of stomach cancer   . Family history of breast cancer   . Family history of melanoma   . Aspirin allergy 05/23/2016  . Familial hyperlipidemia 05/23/2016  . Chest pain with moderate risk of acute coronary syndrome 05/23/2016  . Polyneuropathy in other diseases classified elsewhere ([place]) 01/06/2014  . Muscle cramps 11/11/2013  . Abnormality of gait 11/11/2013  . Type 1 diabetes mellitus with complications ([place]) 29/93/7169  . Essential hypertension 09/27/2008  . MITRAL VALVE PROLAPSE 09/27/2008  . MELANOSIS COLI 09/27/2008  . DEPRESSION, HX OF 09/27/2008    PHYSICAL THERAPY DISCHARGE SUMMARY  Visits from Start of Care: 20  Current functional level related to goals / functional outcomes: See above for current status.  Pt has HEP in place for continued gains.     Remaining deficits: Pt with chronic neck and lumbar pain.  Pt has HEP in place and pain management strategies for continued gains.     Education / Equipment: HEP, Biomedical scientist Plan: Patient agrees to discharge.  Patient goals were partially met. Patient is being discharged due to being pleased with the current functional level.  ?????       Sigurd Sos, PT 12/09/16 11:40 AM  [facility] [address] [place], [place], [address] Phone: [phone]   Fax:  [phone]  Name: Dawn Lewis MRN: [id number] Date of Birth: 01-30-1956

## 2016-12-19 ENCOUNTER — Encounter: Payer: Self-pay | Admitting: Genetics

## 2016-12-19 ENCOUNTER — Ambulatory Visit: Payer: Self-pay | Admitting: Genetics

## 2016-12-19 ENCOUNTER — Telehealth: Payer: Self-pay | Admitting: Genetics

## 2016-12-19 DIAGNOSIS — C4491 Basal cell carcinoma of skin, unspecified: Secondary | ICD-10-CM

## 2016-12-19 DIAGNOSIS — Z1379 Encounter for other screening for genetic and chromosomal anomalies: Secondary | ICD-10-CM

## 2016-12-19 DIAGNOSIS — Z803 Family history of malignant neoplasm of breast: Secondary | ICD-10-CM

## 2016-12-19 DIAGNOSIS — Z800 Family history of malignant neoplasm of digestive organs: Secondary | ICD-10-CM

## 2016-12-19 DIAGNOSIS — Z808 Family history of malignant neoplasm of other organs or systems: Secondary | ICD-10-CM

## 2016-12-19 NOTE — Progress Notes (Signed)
HPI: Dawn Lewis was previously seen in the [place] clinic on 11/21/2016 due to a family history of colon, breast, stomach, and skin cancer and concerns regarding a hereditary predisposition to cancer. Please refer to our prior cancer genetics clinic note for more information regarding [name]'s medical, social and family histories, and our assessment and recommendations, at the time. [name] recent genetic test results were disclosed to her, as well as recommendations warranted by these results. These results and recommendations are discussed in more detail below.   FAMILY HISTORY:  We obtained a detailed, 4-generation family history.  Significant diagnoses are listed below: Family History  Problem Relation Age of Onset  . Stroke Mother   . Breast cancer Mother        dx in 43's  . Thyroid cancer Mother        dx in 51's  . Colon cancer Father        dx in 79's. had part of colon removed and had many polyp after cancer dx.   . Colon cancer Maternal Grandfather        dx 50's/60's.  . Cancer Paternal Grandmother        dx. 23's. stomach/colon cancer- exact origin unk, died in her 34's  . Colon cancer Paternal Grandfather        dx 66's-60's, died in his 21's  . Esophageal cancer Maternal Uncle        dx 80's. hx smoking (maybe)  . Melanoma Maternal Uncle        dx 70's multiple melanomas and BCC's  . Breast cancer Cousin   . Melanoma Maternal Uncle        dx 70's  . Basal cell carcinoma Cousin        several, is now in 32's  . Stomach cancer Other        dx and died in 94's/60's  . Cancer Other        abdominal/stomach cancer, age dx unk  . Colon cancer Other 60  . Breast cancer Other        dx 70's  . Breast cancer Other        dx. >60  . Rectal cancer Neg Hx    [name]' has adopted children, but no biological children.  She has a 29 year-old brother who has never had a colonoscopy, but no history of cancer.  He has no children.   [name]'  father was diagnosed with colon cancer in his 51's.  He had part of his colon removed and after this diagnosis had many polyps.  He died at 27.  [name] has 4 paternal uncles and 1 paternal aunt listed below: -1 paternal uncle died in his 36's/80's and had no children or any history of cancer. -1 paternal uncle died in his 70's/80's and had 2 sons who are in their 48's-60's now. No history of cancer.  -1 paternal uncle died in his 1's/80's and had several children who are in their 68's and 55's.  No history of cancer.  -1 paternal uncle died in his 23's/80's and had several children who are in their 25's and 84's.  No history of cancer.  -1 paternal aunt died in her 22's/80's and had no children or any history of cancer.  [name]' paternal grandfather had colon cancer in his 73's/60's and died in his 58's.  No other information is known about his side of the family.   [name]'  paternal grandmother was diagnosed with cancer in her abdomen.  The patient thinks it was colon or stomach in origin and was diagnosed in her 70's. She died from this cancer in her 67's.  No other information is known about her side of the family.    [name]' mother was diagnosed with breast cancer in her 77's. She was later diagnosed with Thyroid cancer in her 59's.  She had some lumps and had her uterus and 1 ovary removed- the patient reports her mother was told "they didn't like how her ovary looked."  [name] has 2 maternal uncles listed below: -1 maternal uncle was diagnosed with  Melanoma in his 23's and is now 60.  He had 2 sons who are in their 3's and a daughter who is 13. This daughter (patient's cousin) was diagnosed with breast cancer in her 64's.  This cousin has several children in their late 20's and 30's with no history of cancer.  -1 maternal uncle had multiple melanomas and basal cell carcinomas in his 53's.  He was diagnosed with esophogeal cancer at 29 and died shortly after.  The patient  called [place] after her genetic counseling appointment to add that this uncle also had bladder cancer.  He maybe had a history of smoking.  This uncle had 2 daughters who are in their 64's.  One daughter (pateint's cousin) has had many Basal cell carcinomas.  [name]' maternal grandfather was diagnosed with colon cancer in his 65's or 8's.  He later died from lung cancer in his 4's/70's.  He had 2 nieces who had cancer: 1 niece was diagnosed with breast cancer >60, the other niece had colon cancer at 65 and breast cancer in her 52's.  [name]' maternal grandmother died at 29 and had no history of cancer.  She had a sister who died from stomach cancer in her 79's or 16's.  This maternal grandmother's mother had an abdominal/stomach cancer.  The age of diagnosis is unknown.   [name] is unaware of previous family history of genetic testing for hereditary cancer risks. Patient's maternal ancestors are of [place] and [place] descent, and paternal ancestors are of [place] descent. There is no reported Ashkenazi Jewish ancestry. There is no known consanguinity.  GENETIC TEST RESULTS: Genetic testing performed through [facility]'s  Multi-Cancer Panel reported out on 12/13/2016 showed no pathogenic mutations. The Multi-Cancer Panel offered by [facility] includes sequencing and/or deletion duplication testing of the following 83 genes: ALK, APC, ATM, AXIN2,BAP1,  BARD1, BLM, BMPR1A, BRCA1, BRCA2, BRIP1, CASR, CDC73, CDH1, CDK4, CDKN1B, CDKN1C, CDKN2A (p14ARF), CDKN2A (p16INK4a), CEBPA, CHEK2, CTNNA1, DICER1, DIS3L2, EGFR (c.2369C>T, p.Thr790Met variant only), EPCAM (Deletion/duplication testing only), FH, FLCN, GATA2, GPC3, GREM1 (Promoter region deletion/duplication testing only), HOXB13 (c.251G>A, p.Gly84Glu), HRAS, KIT, MAX, MEN1, MET, MITF (c.952G>A, p.Glu318Lys variant only), MLH1, MSH2, MSH3, MSH6, MUTYH, NBN, NF1, NF2, NTHL1, PALB2, PDGFRA, PHOX2B, PMS2, POLD1, POLE, POT1, PRKAR1A, PTCH1, PTEN, RAD50, RAD51C,  RAD51D, RB1, RECQL4, RET, RUNX1, SDHAF2, SDHA (sequence changes only), SDHB, SDHC, SDHD, SMAD4, SMARCA4, SMARCB1, SMARCE1, STK11, SUFU, TERC, TERT, TMEM127, TP53, TSC1, TSC2, VHL, WRN and WT1. .  A variant of uncertain significance (VUS) in a gene called PRKAR1A c.13A>C (p.Ser5Arg) was  noted. A variant of uncertain significance (VUS) in a gene called PTCH1 c.324A>G (p.Ile108Met)was also noted.  A variant of uncertain significance (VUS) in a gene called RB1 c.2726C>A (p.Thr909Lys) was also noted.   The test report will be scanned into EPIC and will be located under the  Molecular Pathology section of the Results Review tab.A portion of the result report is included below for reference.      We discussed with Ms. Saathoff that because current genetic testing is not perfect, it is possible there may be a gene mutation in one of these genes that current testing cannot detect, but that chance is small. We also discussed, that there could be another gene that has not yet been discovered, or that we have not yet tested, that is responsible for the cancer diagnoses in the family. Therefore, it is important to remain in touch with cancer genetics in the future so that we can continue to offer Ms. Taylor the most up to date genetic testing.   Regarding the VUS's in PRKAR1A, PTCH1, and RB1: At this time, it is unknown if these variants are associated with increased cancer risk or if they are normal findings, but most variants such as these get reclassified to being inconsequential. They should not be used to make medical management decisions. With time, we suspect the lab will determine the significance of these variants, if any. If we do learn more about them, we will try to contact Ms. Laymon to discuss it further. However, it is important to stay in touch with [place] periodically and keep the address and phone number up to date.  ADDITIONAL GENETIC TESTING: We discussed with Ms. Vandall that her genetic testing  was fairly extensive.  If there are are genes identified to increase cancer that can be analyzed in the future, we would be happy to discuss and coordinate this testing at that time.      CANCER SCREENING RECOMMENDATIONS:  Based on these negative genetic test results, there areno additional cancer risks we are aware of for Ms. Winnie, and no additional screening or medical management that we would recommend for her at this time based on genetic testing.    This normal result is somewhat reassuring and indicates that it is unlikely Ms. Mesler has an increased risk of cancer due to a mutation in one of these genes.  Therefore, Ms. Uber was advised to continue following the cancer screening guidelines provided by her  healthcare providers. Other factors such as her personal and family history may still affect her cancer risk.    Based on her family history, increased colon screening is recommended (1st degree relative with colon cancer).  She should continue to follow the colon screening recommendations from her Gastroenterologist (reported to be every 5 years).   She still has an increased risk to develop colon cancer simply based on family history.  We also recommended she continue to get routine skin exams based on her personal history of basal cell carcinoma and the family history of skin cancer.      Based on the Ms. Schuchard's personal and family history of cancer, as well as her genetic test results, the statistical model Tobey Bride Cusik was used to estimate her risk of developing breast cancer. This estimates her lifetime risk of developing breast cancer to be approximately 11.8%.  The patient's lifetime breast cancer risk is a preliminary estimate based on available information using one of several models endorsed by the [place] (ACS). The ACS recommends consideration of breast MRI screening as an adjunct to mammography for patients at high risk (defined as 20% or greater lifetime  risk). A more detailed breast cancer risk assessment can be considered, if clinically indicated.  Based on this calculation Ms. Ohlsen is not considered to be  in the 'high risk' category, but is still recommended to have annual mammograms.       RECOMMENDATIONS FOR FAMILY MEMBERS: Women in this family might be at some increased risk of developing cancer, over the general population risk, simply due to the family history of cancer. We recommended women in this family have a yearly mammogram beginning at age 88, or 47 years younger than the earliest onset of cancer, an annual clinical breast exam, and perform monthly breast self-exams. Women in this family should also have a gynecological exam as recommended by their primary provider. All family members should have a colonoscopy by age 20 (or earlier based on family history)  Based on Ms. Cerasoli's family history, we recommended her brother and paternal relatives, have genetic counseling and testing.  Other relatives may meet medical criteria for genetic testing based on their personal and family histories.  Ms. Samudio will let us know if we can be of any assistance in coordinating genetic counseling and/or testing for this family member.   FOLLOW-UP: Lastly, we discussed with Ms. Clinkenbeard that cancer genetics is a rapidly advancing field and it is possible that new genetic tests will be appropriate for her and/or her family members in the future. We encouraged her to remain in contact with cancer genetics on an annual basis so we can update her personal and family histories and let her know of advances in cancer genetics that may benefit this family.   Our contact number was provided. Ms. George questions were answered to her satisfaction, and she knows she is welcome to call us at anytime with additional questions or concerns.   Ferol Luz, MS Genetic Counselor [email or web address]

## 2016-12-19 NOTE — Telephone Encounter (Signed)
Revealed negative genetic test results.  Discussed that 3 variants of uncertain significance were identified in the genes RB1, PTCH1, and PRKAR1A.  Explained that these don't seem to clearly fit the family history, but we cannot be sure of their significance at this time.  However, we would not recommend changing medical management based on them. Told her other relatives could have genetic testing based on the family history of colon cancer.  Told her to keep having colonoscopies as directed by her GI physicians and to keep getting routine skin exams.

## 2017-01-07 DIAGNOSIS — D692 Other nonthrombocytopenic purpura: Secondary | ICD-10-CM | POA: Diagnosis not present

## 2017-01-07 DIAGNOSIS — D225 Melanocytic nevi of trunk: Secondary | ICD-10-CM | POA: Diagnosis not present

## 2017-01-07 DIAGNOSIS — D1801 Hemangioma of skin and subcutaneous tissue: Secondary | ICD-10-CM | POA: Diagnosis not present

## 2017-01-07 DIAGNOSIS — L570 Actinic keratosis: Secondary | ICD-10-CM | POA: Diagnosis not present

## 2017-01-07 DIAGNOSIS — Z85828 Personal history of other malignant neoplasm of skin: Secondary | ICD-10-CM | POA: Diagnosis not present

## 2017-01-14 DIAGNOSIS — R5382 Chronic fatigue, unspecified: Secondary | ICD-10-CM | POA: Diagnosis not present

## 2017-01-14 DIAGNOSIS — M7989 Other specified soft tissue disorders: Secondary | ICD-10-CM | POA: Diagnosis not present

## 2017-01-14 DIAGNOSIS — M0609 Rheumatoid arthritis without rheumatoid factor, multiple sites: Secondary | ICD-10-CM | POA: Diagnosis not present

## 2017-01-14 DIAGNOSIS — M2550 Pain in unspecified joint: Secondary | ICD-10-CM | POA: Diagnosis not present

## 2017-01-23 DIAGNOSIS — E1040 Type 1 diabetes mellitus with diabetic neuropathy, unspecified: Secondary | ICD-10-CM | POA: Diagnosis not present

## 2017-01-23 DIAGNOSIS — M0600 Rheumatoid arthritis without rheumatoid factor, unspecified site: Secondary | ICD-10-CM | POA: Diagnosis not present

## 2017-01-23 DIAGNOSIS — Z4681 Encounter for fitting and adjustment of insulin pump: Secondary | ICD-10-CM | POA: Diagnosis not present

## 2017-01-23 DIAGNOSIS — Z794 Long term (current) use of insulin: Secondary | ICD-10-CM | POA: Diagnosis not present

## 2017-01-23 DIAGNOSIS — Z23 Encounter for immunization: Secondary | ICD-10-CM | POA: Diagnosis not present

## 2017-02-03 DIAGNOSIS — E109 Type 1 diabetes mellitus without complications: Secondary | ICD-10-CM | POA: Diagnosis not present

## 2017-02-05 DIAGNOSIS — E109 Type 1 diabetes mellitus without complications: Secondary | ICD-10-CM | POA: Diagnosis not present

## 2017-02-07 DIAGNOSIS — E109 Type 1 diabetes mellitus without complications: Secondary | ICD-10-CM | POA: Diagnosis not present

## 2017-02-12 DIAGNOSIS — K9041 Non-celiac gluten sensitivity: Secondary | ICD-10-CM | POA: Diagnosis not present

## 2017-02-21 DIAGNOSIS — C44622 Squamous cell carcinoma of skin of right upper limb, including shoulder: Secondary | ICD-10-CM | POA: Diagnosis not present

## 2017-02-27 DIAGNOSIS — F321 Major depressive disorder, single episode, moderate: Secondary | ICD-10-CM | POA: Diagnosis not present

## 2017-02-27 DIAGNOSIS — Z794 Long term (current) use of insulin: Secondary | ICD-10-CM | POA: Diagnosis not present

## 2017-02-27 DIAGNOSIS — E1040 Type 1 diabetes mellitus with diabetic neuropathy, unspecified: Secondary | ICD-10-CM | POA: Diagnosis not present

## 2017-02-27 DIAGNOSIS — E7801 Familial hypercholesterolemia: Secondary | ICD-10-CM | POA: Diagnosis not present

## 2017-02-27 DIAGNOSIS — E038 Other specified hypothyroidism: Secondary | ICD-10-CM | POA: Diagnosis not present

## 2017-02-27 DIAGNOSIS — E1140 Type 2 diabetes mellitus with diabetic neuropathy, unspecified: Secondary | ICD-10-CM | POA: Diagnosis not present

## 2017-04-30 DIAGNOSIS — E109 Type 1 diabetes mellitus without complications: Secondary | ICD-10-CM | POA: Diagnosis not present

## 2017-05-01 DIAGNOSIS — E109 Type 1 diabetes mellitus without complications: Secondary | ICD-10-CM | POA: Diagnosis not present

## 2017-05-06 DIAGNOSIS — E109 Type 1 diabetes mellitus without complications: Secondary | ICD-10-CM | POA: Diagnosis not present

## 2017-05-19 DIAGNOSIS — M7989 Other specified soft tissue disorders: Secondary | ICD-10-CM | POA: Diagnosis not present

## 2017-05-19 DIAGNOSIS — M2550 Pain in unspecified joint: Secondary | ICD-10-CM | POA: Diagnosis not present

## 2017-05-19 DIAGNOSIS — M0609 Rheumatoid arthritis without rheumatoid factor, multiple sites: Secondary | ICD-10-CM | POA: Diagnosis not present

## 2017-05-19 DIAGNOSIS — Z719 Counseling, unspecified: Secondary | ICD-10-CM | POA: Diagnosis not present

## 2017-05-22 DIAGNOSIS — Z794 Long term (current) use of insulin: Secondary | ICD-10-CM | POA: Diagnosis not present

## 2017-05-22 DIAGNOSIS — Z4681 Encounter for fitting and adjustment of insulin pump: Secondary | ICD-10-CM | POA: Diagnosis not present

## 2017-05-22 DIAGNOSIS — E1040 Type 1 diabetes mellitus with diabetic neuropathy, unspecified: Secondary | ICD-10-CM | POA: Diagnosis not present

## 2017-05-22 DIAGNOSIS — Z6822 Body mass index (BMI) 22.0-22.9, adult: Secondary | ICD-10-CM | POA: Diagnosis not present

## 2017-05-27 DIAGNOSIS — L820 Inflamed seborrheic keratosis: Secondary | ICD-10-CM | POA: Diagnosis not present

## 2017-05-27 DIAGNOSIS — Z85828 Personal history of other malignant neoplasm of skin: Secondary | ICD-10-CM | POA: Diagnosis not present

## 2017-06-02 DIAGNOSIS — E7801 Familial hypercholesterolemia: Secondary | ICD-10-CM | POA: Diagnosis not present

## 2017-06-02 DIAGNOSIS — E1140 Type 2 diabetes mellitus with diabetic neuropathy, unspecified: Secondary | ICD-10-CM | POA: Diagnosis not present

## 2017-06-02 DIAGNOSIS — M0600 Rheumatoid arthritis without rheumatoid factor, unspecified site: Secondary | ICD-10-CM | POA: Diagnosis not present

## 2017-06-02 DIAGNOSIS — E1040 Type 1 diabetes mellitus with diabetic neuropathy, unspecified: Secondary | ICD-10-CM | POA: Diagnosis not present

## 2017-07-03 DIAGNOSIS — Z794 Long term (current) use of insulin: Secondary | ICD-10-CM | POA: Diagnosis not present

## 2017-07-03 DIAGNOSIS — Z4681 Encounter for fitting and adjustment of insulin pump: Secondary | ICD-10-CM | POA: Diagnosis not present

## 2017-07-03 DIAGNOSIS — Z6823 Body mass index (BMI) 23.0-23.9, adult: Secondary | ICD-10-CM | POA: Diagnosis not present

## 2017-07-03 DIAGNOSIS — E1040 Type 1 diabetes mellitus with diabetic neuropathy, unspecified: Secondary | ICD-10-CM | POA: Diagnosis not present

## 2017-08-14 DIAGNOSIS — E1040 Type 1 diabetes mellitus with diabetic neuropathy, unspecified: Secondary | ICD-10-CM | POA: Diagnosis not present

## 2017-08-14 DIAGNOSIS — Z4681 Encounter for fitting and adjustment of insulin pump: Secondary | ICD-10-CM | POA: Diagnosis not present

## 2017-08-14 DIAGNOSIS — M0600 Rheumatoid arthritis without rheumatoid factor, unspecified site: Secondary | ICD-10-CM | POA: Diagnosis not present

## 2017-09-01 DIAGNOSIS — R5382 Chronic fatigue, unspecified: Secondary | ICD-10-CM | POA: Diagnosis not present

## 2017-09-01 DIAGNOSIS — M0609 Rheumatoid arthritis without rheumatoid factor, multiple sites: Secondary | ICD-10-CM | POA: Diagnosis not present

## 2017-09-01 DIAGNOSIS — M2550 Pain in unspecified joint: Secondary | ICD-10-CM | POA: Diagnosis not present

## 2017-09-10 DIAGNOSIS — M0600 Rheumatoid arthritis without rheumatoid factor, unspecified site: Secondary | ICD-10-CM | POA: Diagnosis not present

## 2017-09-10 DIAGNOSIS — Z794 Long term (current) use of insulin: Secondary | ICD-10-CM | POA: Diagnosis not present

## 2017-09-10 DIAGNOSIS — E1140 Type 2 diabetes mellitus with diabetic neuropathy, unspecified: Secondary | ICD-10-CM | POA: Diagnosis not present

## 2017-09-10 DIAGNOSIS — E1040 Type 1 diabetes mellitus with diabetic neuropathy, unspecified: Secondary | ICD-10-CM | POA: Diagnosis not present

## 2017-09-12 DIAGNOSIS — E109 Type 1 diabetes mellitus without complications: Secondary | ICD-10-CM | POA: Diagnosis not present

## 2017-10-09 DIAGNOSIS — Z1231 Encounter for screening mammogram for malignant neoplasm of breast: Secondary | ICD-10-CM | POA: Diagnosis not present

## 2017-10-09 DIAGNOSIS — Z6823 Body mass index (BMI) 23.0-23.9, adult: Secondary | ICD-10-CM | POA: Diagnosis not present

## 2017-10-09 DIAGNOSIS — Z01419 Encounter for gynecological examination (general) (routine) without abnormal findings: Secondary | ICD-10-CM | POA: Diagnosis not present

## 2017-11-13 DIAGNOSIS — Z4681 Encounter for fitting and adjustment of insulin pump: Secondary | ICD-10-CM | POA: Diagnosis not present

## 2017-11-13 DIAGNOSIS — J069 Acute upper respiratory infection, unspecified: Secondary | ICD-10-CM | POA: Diagnosis not present

## 2017-11-13 DIAGNOSIS — E109 Type 1 diabetes mellitus without complications: Secondary | ICD-10-CM | POA: Diagnosis not present

## 2017-11-13 DIAGNOSIS — E1040 Type 1 diabetes mellitus with diabetic neuropathy, unspecified: Secondary | ICD-10-CM | POA: Diagnosis not present

## 2017-12-01 DIAGNOSIS — E1040 Type 1 diabetes mellitus with diabetic neuropathy, unspecified: Secondary | ICD-10-CM | POA: Diagnosis not present

## 2017-12-01 DIAGNOSIS — Z4681 Encounter for fitting and adjustment of insulin pump: Secondary | ICD-10-CM | POA: Diagnosis not present

## 2018-01-01 DIAGNOSIS — Z6823 Body mass index (BMI) 23.0-23.9, adult: Secondary | ICD-10-CM | POA: Diagnosis not present

## 2018-01-01 DIAGNOSIS — E1040 Type 1 diabetes mellitus with diabetic neuropathy, unspecified: Secondary | ICD-10-CM | POA: Diagnosis not present

## 2018-01-01 DIAGNOSIS — I8310 Varicose veins of unspecified lower extremity with inflammation: Secondary | ICD-10-CM | POA: Diagnosis not present

## 2018-01-15 DIAGNOSIS — Z794 Long term (current) use of insulin: Secondary | ICD-10-CM | POA: Diagnosis not present

## 2018-01-15 DIAGNOSIS — E038 Other specified hypothyroidism: Secondary | ICD-10-CM | POA: Diagnosis not present

## 2018-01-15 DIAGNOSIS — E7801 Familial hypercholesterolemia: Secondary | ICD-10-CM | POA: Diagnosis not present

## 2018-01-15 DIAGNOSIS — M0600 Rheumatoid arthritis without rheumatoid factor, unspecified site: Secondary | ICD-10-CM | POA: Diagnosis not present

## 2018-01-15 DIAGNOSIS — M4302 Spondylolysis, cervical region: Secondary | ICD-10-CM | POA: Diagnosis not present

## 2018-01-15 DIAGNOSIS — E1040 Type 1 diabetes mellitus with diabetic neuropathy, unspecified: Secondary | ICD-10-CM | POA: Diagnosis not present

## 2018-02-10 DIAGNOSIS — L820 Inflamed seborrheic keratosis: Secondary | ICD-10-CM | POA: Diagnosis not present

## 2018-02-10 DIAGNOSIS — L739 Follicular disorder, unspecified: Secondary | ICD-10-CM | POA: Diagnosis not present

## 2018-02-10 DIAGNOSIS — L821 Other seborrheic keratosis: Secondary | ICD-10-CM | POA: Diagnosis not present

## 2018-02-10 DIAGNOSIS — L738 Other specified follicular disorders: Secondary | ICD-10-CM | POA: Diagnosis not present

## 2018-02-10 DIAGNOSIS — D1801 Hemangioma of skin and subcutaneous tissue: Secondary | ICD-10-CM | POA: Diagnosis not present

## 2018-02-10 DIAGNOSIS — D225 Melanocytic nevi of trunk: Secondary | ICD-10-CM | POA: Diagnosis not present

## 2018-02-10 DIAGNOSIS — Z85828 Personal history of other malignant neoplasm of skin: Secondary | ICD-10-CM | POA: Diagnosis not present

## 2018-02-16 DIAGNOSIS — D225 Melanocytic nevi of trunk: Secondary | ICD-10-CM | POA: Diagnosis not present

## 2018-02-16 DIAGNOSIS — L738 Other specified follicular disorders: Secondary | ICD-10-CM | POA: Diagnosis not present

## 2018-02-18 DIAGNOSIS — E109 Type 1 diabetes mellitus without complications: Secondary | ICD-10-CM | POA: Diagnosis not present

## 2018-03-05 DIAGNOSIS — E1337X1 Other specified diabetes mellitus with diabetic macular edema, resolved following treatment, right eye: Secondary | ICD-10-CM | POA: Diagnosis not present

## 2018-03-05 DIAGNOSIS — M79674 Pain in right toe(s): Secondary | ICD-10-CM | POA: Diagnosis not present

## 2018-03-05 DIAGNOSIS — S92424A Nondisplaced fracture of distal phalanx of right great toe, initial encounter for closed fracture: Secondary | ICD-10-CM | POA: Diagnosis not present

## 2018-03-19 DIAGNOSIS — Z4681 Encounter for fitting and adjustment of insulin pump: Secondary | ICD-10-CM | POA: Diagnosis not present

## 2018-03-19 DIAGNOSIS — E1140 Type 2 diabetes mellitus with diabetic neuropathy, unspecified: Secondary | ICD-10-CM | POA: Diagnosis not present

## 2018-04-22 DIAGNOSIS — M79642 Pain in left hand: Secondary | ICD-10-CM | POA: Diagnosis not present

## 2018-04-22 DIAGNOSIS — M0609 Rheumatoid arthritis without rheumatoid factor, multiple sites: Secondary | ICD-10-CM | POA: Diagnosis not present

## 2018-04-22 DIAGNOSIS — M2550 Pain in unspecified joint: Secondary | ICD-10-CM | POA: Diagnosis not present

## 2018-04-22 DIAGNOSIS — R5382 Chronic fatigue, unspecified: Secondary | ICD-10-CM | POA: Diagnosis not present

## 2018-04-22 DIAGNOSIS — M79641 Pain in right hand: Secondary | ICD-10-CM | POA: Diagnosis not present

## 2018-05-21 DIAGNOSIS — E109 Type 1 diabetes mellitus without complications: Secondary | ICD-10-CM | POA: Diagnosis not present

## 2018-06-18 DIAGNOSIS — Z4681 Encounter for fitting and adjustment of insulin pump: Secondary | ICD-10-CM | POA: Diagnosis not present

## 2018-06-18 DIAGNOSIS — E1040 Type 1 diabetes mellitus with diabetic neuropathy, unspecified: Secondary | ICD-10-CM | POA: Diagnosis not present

## 2018-06-18 DIAGNOSIS — Z6824 Body mass index (BMI) 24.0-24.9, adult: Secondary | ICD-10-CM | POA: Diagnosis not present

## 2018-06-18 DIAGNOSIS — M7989 Other specified soft tissue disorders: Secondary | ICD-10-CM | POA: Diagnosis not present

## 2018-07-15 DIAGNOSIS — Z0000 Encounter for general adult medical examination without abnormal findings: Secondary | ICD-10-CM | POA: Diagnosis not present

## 2018-07-15 DIAGNOSIS — E1040 Type 1 diabetes mellitus with diabetic neuropathy, unspecified: Secondary | ICD-10-CM | POA: Diagnosis not present

## 2018-07-15 DIAGNOSIS — E038 Other specified hypothyroidism: Secondary | ICD-10-CM | POA: Diagnosis not present

## 2018-07-16 DIAGNOSIS — E1040 Type 1 diabetes mellitus with diabetic neuropathy, unspecified: Secondary | ICD-10-CM | POA: Diagnosis not present

## 2018-07-16 DIAGNOSIS — R82998 Other abnormal findings in urine: Secondary | ICD-10-CM | POA: Diagnosis not present

## 2018-07-22 DIAGNOSIS — F321 Major depressive disorder, single episode, moderate: Secondary | ICD-10-CM | POA: Diagnosis not present

## 2018-07-22 DIAGNOSIS — J302 Other seasonal allergic rhinitis: Secondary | ICD-10-CM | POA: Diagnosis not present

## 2018-07-22 DIAGNOSIS — E039 Hypothyroidism, unspecified: Secondary | ICD-10-CM | POA: Diagnosis not present

## 2018-07-22 DIAGNOSIS — Z0000 Encounter for general adult medical examination without abnormal findings: Secondary | ICD-10-CM | POA: Diagnosis not present

## 2018-07-22 DIAGNOSIS — M4302 Spondylolysis, cervical region: Secondary | ICD-10-CM | POA: Diagnosis not present

## 2018-07-22 DIAGNOSIS — Z1331 Encounter for screening for depression: Secondary | ICD-10-CM | POA: Diagnosis not present

## 2018-08-19 DIAGNOSIS — E109 Type 1 diabetes mellitus without complications: Secondary | ICD-10-CM | POA: Diagnosis not present

## 2018-09-16 DIAGNOSIS — E1040 Type 1 diabetes mellitus with diabetic neuropathy, unspecified: Secondary | ICD-10-CM | POA: Diagnosis not present

## 2018-09-16 DIAGNOSIS — Z4681 Encounter for fitting and adjustment of insulin pump: Secondary | ICD-10-CM | POA: Diagnosis not present

## 2018-10-21 DIAGNOSIS — R5382 Chronic fatigue, unspecified: Secondary | ICD-10-CM | POA: Diagnosis not present

## 2018-10-21 DIAGNOSIS — M0609 Rheumatoid arthritis without rheumatoid factor, multiple sites: Secondary | ICD-10-CM | POA: Diagnosis not present

## 2018-10-21 DIAGNOSIS — M2550 Pain in unspecified joint: Secondary | ICD-10-CM | POA: Diagnosis not present

## 2018-11-02 DIAGNOSIS — Z6824 Body mass index (BMI) 24.0-24.9, adult: Secondary | ICD-10-CM | POA: Diagnosis not present

## 2018-11-02 DIAGNOSIS — Z1231 Encounter for screening mammogram for malignant neoplasm of breast: Secondary | ICD-10-CM | POA: Diagnosis not present

## 2018-11-02 DIAGNOSIS — Z13820 Encounter for screening for osteoporosis: Secondary | ICD-10-CM | POA: Diagnosis not present

## 2018-11-02 DIAGNOSIS — Z01419 Encounter for gynecological examination (general) (routine) without abnormal findings: Secondary | ICD-10-CM | POA: Diagnosis not present

## 2018-11-16 ENCOUNTER — Encounter: Payer: Self-pay | Admitting: Gastroenterology

## 2018-11-18 DIAGNOSIS — E109 Type 1 diabetes mellitus without complications: Secondary | ICD-10-CM | POA: Diagnosis not present

## 2018-11-19 DIAGNOSIS — E109 Type 1 diabetes mellitus without complications: Secondary | ICD-10-CM | POA: Diagnosis not present

## 2018-11-24 ENCOUNTER — Encounter: Payer: Self-pay | Admitting: Gastroenterology

## 2018-12-03 DIAGNOSIS — L718 Other rosacea: Secondary | ICD-10-CM | POA: Diagnosis not present

## 2018-12-03 DIAGNOSIS — L570 Actinic keratosis: Secondary | ICD-10-CM | POA: Diagnosis not present

## 2018-12-03 DIAGNOSIS — E109 Type 1 diabetes mellitus without complications: Secondary | ICD-10-CM | POA: Diagnosis not present

## 2018-12-09 ENCOUNTER — Telehealth: Payer: Self-pay | Admitting: *Deleted

## 2018-12-09 NOTE — Telephone Encounter (Signed)
Dawn Lewis,   Please contact PCP for insulin pump instructions. Pt is for colonoscopy with Dr.Nandigam on 12/31/2018. PV is on 12/11/2018. Thanks, Robbin pv

## 2018-12-09 NOTE — Telephone Encounter (Signed)
Dawn Lewis [birth year] [id number]  Dear Dr. Osborne Casco   Dr Silverio Decamp has scheduled the above patient for a colonoscopy  at  on 12/31/2018.  Our records show that he/she is on insulin therapy via an insulin pump.  Our colonoscopy prep protocol requires that:  the patient must be on a clear liquid diet the entire day prior to the procedure date as well as the morning of the procedure  the patient must be NPO for 3  hours prior to the procedure   the patient must consume a PEG 3350 solution to prepare for the procedure.  Please advise [place] of any adjustments that need to be made to the patient's insulin pump therapy prior to the above procedure date.    Please route or fax back this completed form to me at [phone] .  If you have any question, please call me at [phone].  Thank you for your help with this matter.  Sincerely,   Kavithia Nandigam

## 2018-12-11 ENCOUNTER — Other Ambulatory Visit: Payer: Self-pay

## 2018-12-11 ENCOUNTER — Ambulatory Visit (AMBULATORY_SURGERY_CENTER): Payer: Self-pay | Admitting: *Deleted

## 2018-12-11 VITALS — Temp 97.5°F | Ht 70.0 in | Wt 171.8 lb

## 2018-12-11 DIAGNOSIS — Z800 Family history of malignant neoplasm of digestive organs: Secondary | ICD-10-CM

## 2018-12-11 MED ORDER — NA SULFATE-K SULFATE-MG SULF 17.5-3.13-1.6 GM/177ML PO SOLN: 1.0000 | Freq: Once | ORAL | 0 refills | Status: AC

## 2018-12-11 NOTE — Progress Notes (Signed)
No egg or soy allergy known to patient  No issues with past sedation with any surgeries  or procedures, no intubation problems  No diet pills per patient No home 02 use per patient  No blood thinners per patient  Pt denies issues with constipation  No A fib or A flutter  EMMI video sent to pt's e mail   Coupon provided for suprep

## 2018-12-18 ENCOUNTER — Encounter: Payer: Self-pay | Admitting: Gastroenterology

## 2018-12-20 IMAGING — MG 2D DIGITAL DIAGNOSTIC UNILATERAL LEFT MAMMOGRAM WITH CAD AND ADJ
6 of 9 series · 6 of 21 positions shown · non-contrast
Comparison: Previous exam(s).

CLINICAL DATA: Callback from screening mammogram for possible left
breast asymmetry

EXAM:
2D DIGITAL DIAGNOSTIC LEFT MAMMOGRAM WITH CAD AND ADJUNCT TOMO
ULTRASOUND LEFT BREAST

[L ML (1 of 2)]
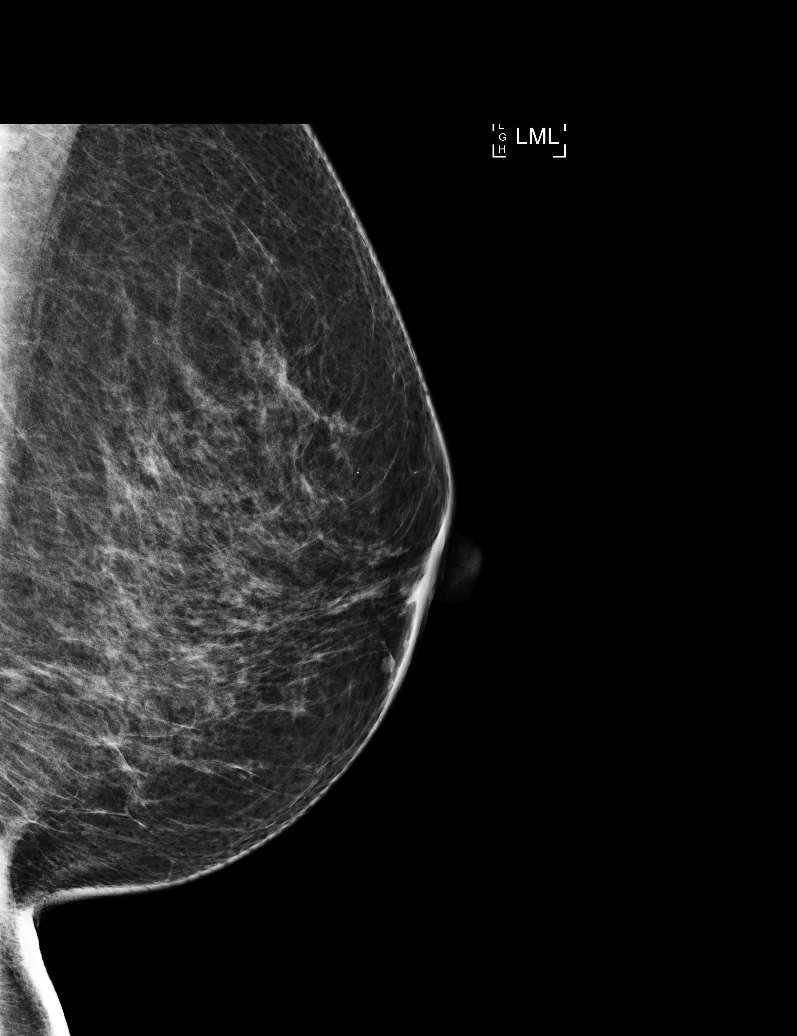

[L ML synth-2D (1 of 2)]
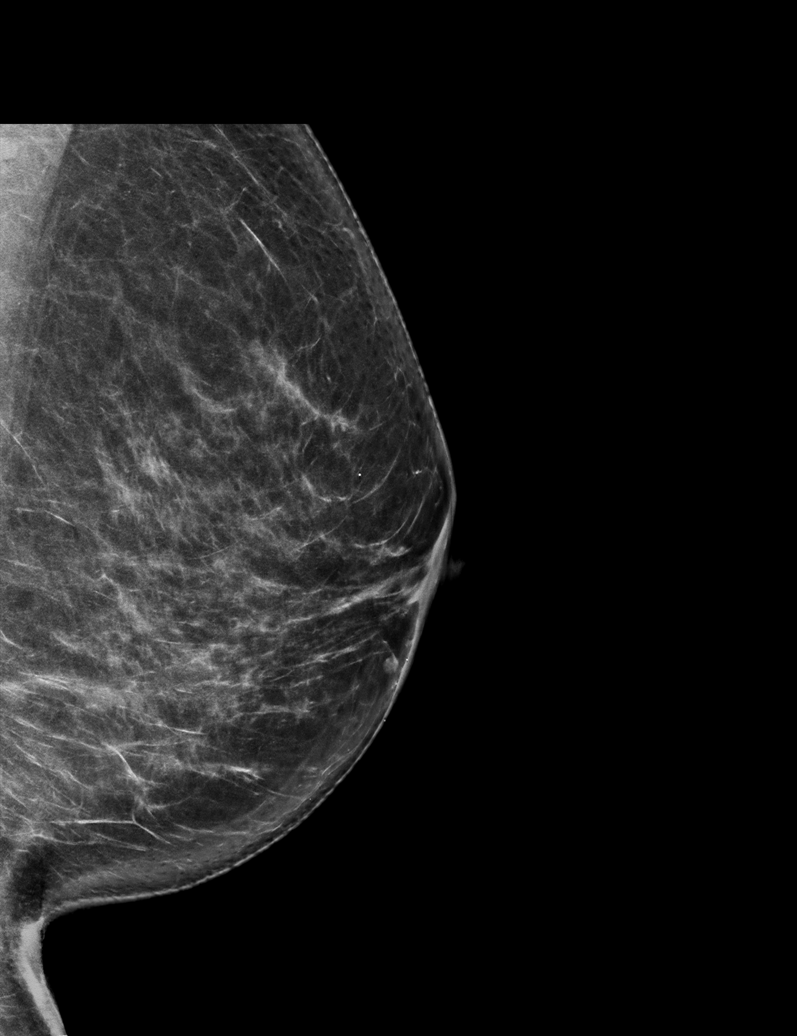

[L ML synth-2D (2 of 2)]
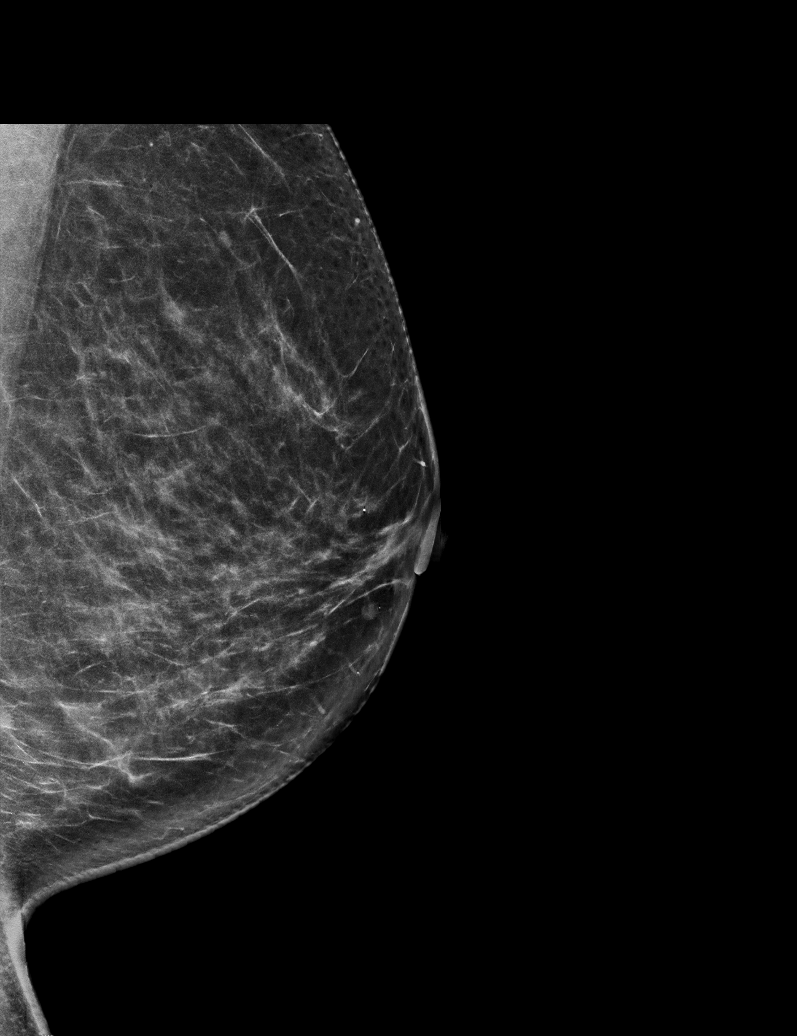

[L CC]
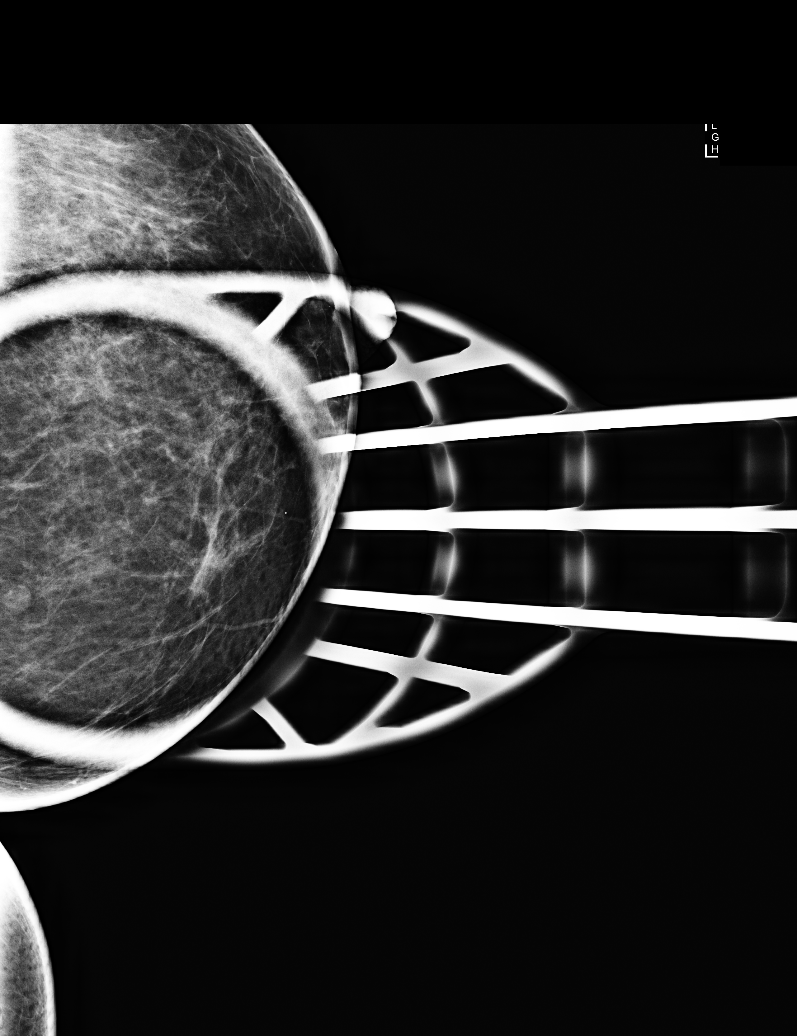

[L CC synth-2D]
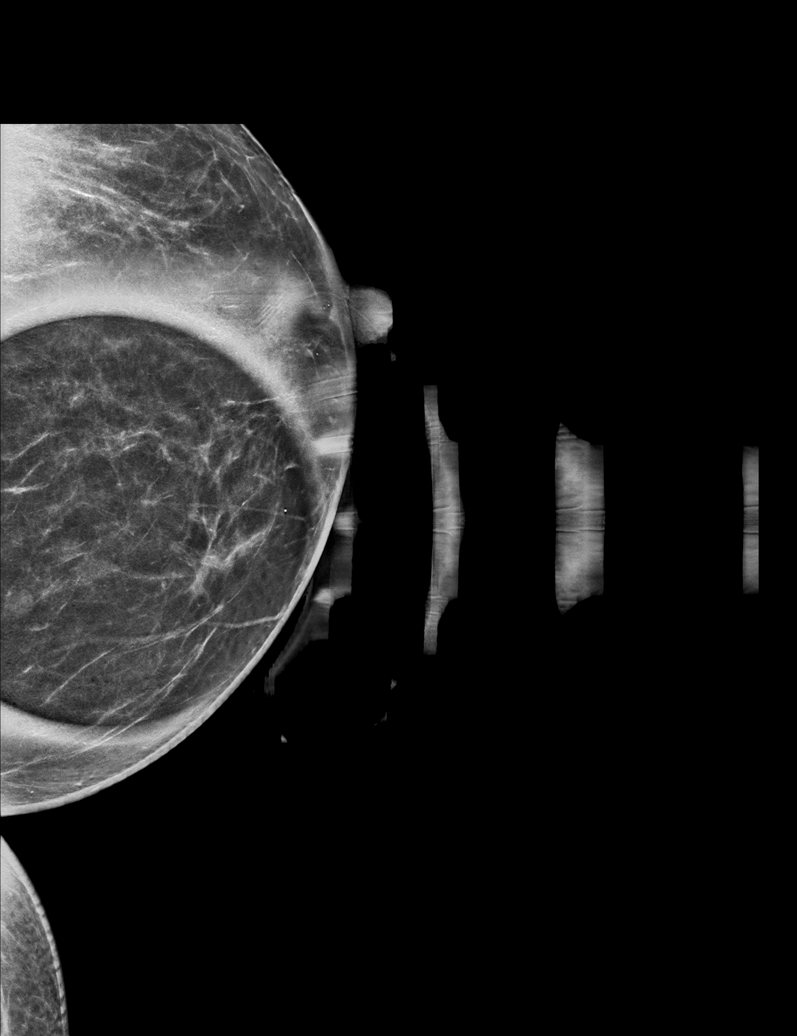

[L ML (2 of 2)]
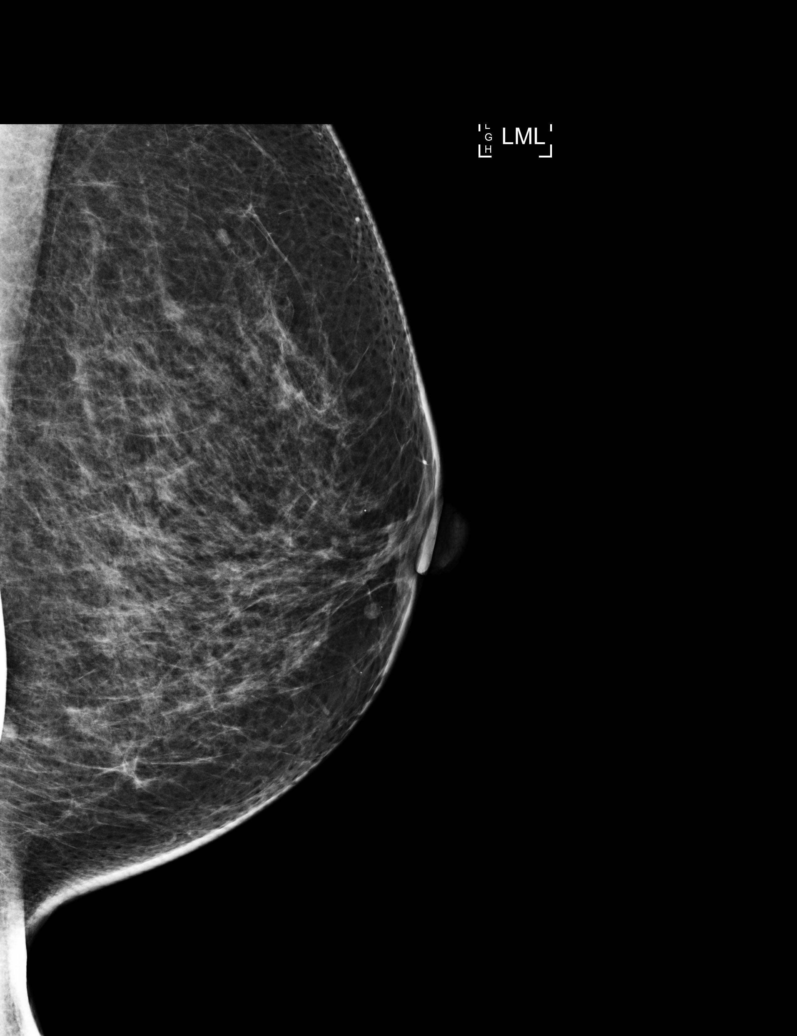

[6 of 21 positions shown; findings below may reference images not displayed]

ACR Breast Density Category b: There are scattered areas of
fibroglandular density.
FINDINGS: Additional views demonstrate a 7 mm oval, circumscribed, low-density
mass within the lower, inner left breast, corresponding to the
finding seen on screening mammogram.

Mammographic images were processed with CAD.

On physical exam, no discrete mass is felt in the area of concern
within the lower, inner left breast.

Targeted ultrasound of the medial left breast was performed
demonstrating an oval, circumscribed, near anechoic mass with
increased through transmission at [DATE], 8 cm from the nipple
measuring 6 x 3 x 7 mm consistent with a small cyst and
corresponding to the mammographic finding. No internal vascularity
is identified.
IMPRESSION: Left breast cyst. No mammographic or sonographic evidence of
malignancy.

RECOMMENDATION:
Bilateral screening mammogram in September 2017.

I have discussed the findings and recommendations with the patient.
Results were also provided in writing at the conclusion of the
visit. If applicable, a reminder letter will be sent to the patient
regarding the next appointment.

BI-RADS CATEGORY  2: Benign.

## 2018-12-25 NOTE — Telephone Encounter (Signed)
Faxed letter to Dr Osborne Casco and routed message to him x 2

## 2018-12-28 NOTE — Telephone Encounter (Signed)
Spoke with the patient. Notified her that we have not heard from [place] office. Pt states she knows what to do with her insulin pump. I did tell her we would call her as soon as the PCP gives Dawn Lewis the instructions or for her to contact them herself.

## 2018-12-29 DIAGNOSIS — Z4681 Encounter for fitting and adjustment of insulin pump: Secondary | ICD-10-CM | POA: Diagnosis not present

## 2018-12-29 DIAGNOSIS — E1040 Type 1 diabetes mellitus with diabetic neuropathy, unspecified: Secondary | ICD-10-CM | POA: Diagnosis not present

## 2018-12-29 DIAGNOSIS — Z794 Long term (current) use of insulin: Secondary | ICD-10-CM | POA: Diagnosis not present

## 2018-12-30 ENCOUNTER — Telehealth: Payer: Self-pay

## 2018-12-30 NOTE — Telephone Encounter (Signed)
Covid-19 screening questions   Do you now or have you had a fever in the last 14 days? NO   Do you have any respiratory symptoms of shortness of breath or cough now or in the last 14 days? NO  Do you have any family members or close contacts with diagnosed or suspected Covid-19 in the past 14 days? NO  Have you been tested for Covid-19 and found to be positive? NO        

## 2018-12-31 ENCOUNTER — Ambulatory Visit (AMBULATORY_SURGERY_CENTER): Payer: BC Managed Care – PPO | Admitting: Gastroenterology

## 2018-12-31 ENCOUNTER — Other Ambulatory Visit: Payer: Self-pay | Admitting: Gastroenterology

## 2018-12-31 ENCOUNTER — Encounter: Payer: Self-pay | Admitting: Gastroenterology

## 2018-12-31 ENCOUNTER — Other Ambulatory Visit: Payer: Self-pay

## 2018-12-31 VITALS — BP 95/69 | HR 55 | Temp 98.8°F | Resp 13 | Ht 70.0 in | Wt 170.0 lb

## 2018-12-31 DIAGNOSIS — D123 Benign neoplasm of transverse colon: Secondary | ICD-10-CM | POA: Diagnosis not present

## 2018-12-31 DIAGNOSIS — Z1211 Encounter for screening for malignant neoplasm of colon: Secondary | ICD-10-CM | POA: Diagnosis not present

## 2018-12-31 DIAGNOSIS — Z800 Family history of malignant neoplasm of digestive organs: Secondary | ICD-10-CM

## 2018-12-31 MED ORDER — SODIUM CHLORIDE 0.9 % IV SOLN: 500.0000 mL | Freq: Once | INTRAVENOUS | Status: DC

## 2018-12-31 NOTE — Progress Notes (Signed)
PT taken to PACU. Monitors in place. VSS. Report given to RN. 

## 2018-12-31 NOTE — Progress Notes (Signed)
Called to room to assist during endoscopic procedure.  Patient ID and intended procedure confirmed with present staff. Received instructions for my participation in the procedure from the performing physician.  

## 2018-12-31 NOTE — Progress Notes (Signed)
Temp check by JB/vital check by CW.  No changes to medical or surgical history since pre-visit screening on 8/28.

## 2018-12-31 NOTE — Patient Instructions (Signed)
Information on polyps and hemorrhoids given to you today.  Await pathology results.  YOU HAD AN ENDOSCOPIC PROCEDURE TODAY AT THE [facility]:   Refer to the procedure report that was given to you for any specific questions about what was found during the examination.  If the procedure report does not answer your questions, please call your gastroenterologist to clarify.  If you requested that your care partner not be given the details of your procedure findings, then the procedure report has been included in a sealed envelope for you to review at your convenience later.  YOU SHOULD EXPECT: Some feelings of bloating in the abdomen. Passage of more gas than usual.  Walking can help get rid of the air that was put into your GI tract during the procedure and reduce the bloating. If you had a lower endoscopy (such as a colonoscopy or flexible sigmoidoscopy) you may notice spotting of blood in your stool or on the toilet paper. If you underwent a bowel prep for your procedure, you may not have a normal bowel movement for a few days.  Please Note:  You might notice some irritation and congestion in your nose or some drainage.  This is from the oxygen used during your procedure.  There is no need for concern and it should clear up in a day or so.  SYMPTOMS TO REPORT IMMEDIATELY:   Following lower endoscopy (colonoscopy or flexible sigmoidoscopy):  Excessive amounts of blood in the stool  Significant tenderness or worsening of abdominal pains  Swelling of the abdomen that is new, acute  Fever of 100F or higher   For urgent or emergent issues, a gastroenterologist can be reached at any hour by calling [phone].   DIET:  We do recommend a small meal at first, but then you may proceed to your regular diet.  Drink plenty of fluids but you should avoid alcoholic beverages for 24 hours.  ACTIVITY:  You should plan to take it easy for the rest of today and you should NOT DRIVE or use  heavy machinery until tomorrow (because of the sedation medicines used during the test).    FOLLOW UP: Our staff will call the number listed on your records 48-72 hours following your procedure to check on you and address any questions or concerns that you may have regarding the information given to you following your procedure. If we do not reach you, we will leave a message.  We will attempt to reach you two times.  During this call, we will ask if you have developed any symptoms of COVID 19. If you develop any symptoms (ie: fever, flu-like symptoms, shortness of breath, cough etc.) before then, please call [phone].  If you test positive for Covid 19 in the 2 weeks post procedure, please call and report this information to us.    If any biopsies were taken you will be contacted by phone or by letter within the next 1-3 weeks.  Please call us at [phone] if you have not heard about the biopsies in 3 weeks.    SIGNATURES/CONFIDENTIALITY: You and/or your care partner have signed paperwork which will be entered into your electronic medical record.  These signatures attest to the fact that that the information above on your After Visit Summary has been reviewed and is understood.  Full responsibility of the confidentiality of this discharge information lies with you and/or your care-partner. 

## 2018-12-31 NOTE — Op Note (Signed)
[facility] Patient Name: Dawn Lewis Procedure Date: 12/31/2018 8:06 AM MRN: [id number] Endoscopist: Mauri Pole , MD Age: 63 Referring MD:  Date of Birth: 12/28/1955 Gender: Female Account #: [id number] Procedure:                Colonoscopy Indications:              Screening in patient at increased risk: Family                            history of 1st-degree relative with colorectal                            cancer Medicines:                Monitored Anesthesia Care Procedure:                Pre-Anesthesia Assessment:                           - Prior to the procedure, a History and Physical                            was performed, and patient medications and                            allergies were reviewed. The patient's tolerance of                            previous anesthesia was also reviewed. The risks                            and benefits of the procedure and the sedation                            options and risks were discussed with the patient.                            All questions were answered, and informed consent                            was obtained. Prior Anticoagulants: The patient has                            taken no previous anticoagulant or antiplatelet                            agents. ASA Grade Assessment: II - A patient with                            mild systemic disease. After reviewing the risks                            and benefits, the patient was deemed in  satisfactory condition to undergo the procedure.                           After obtaining informed consent, the colonoscope                            was passed under direct vision. Throughout the                            procedure, the patient's blood pressure, pulse, and                            oxygen saturations were monitored continuously. The                            Colonoscope was introduced through the anus and                          advanced to the the cecum, identified by                            appendiceal orifice and ileocecal valve. The                            colonoscopy was performed without difficulty. The                            patient tolerated the procedure well. The quality                            of the bowel preparation was excellent. The                            ileocecal valve, appendiceal orifice, and rectum                            were photographed. Scope In: 8:15:31 AM Scope Out: 8:31:45 AM Scope Withdrawal Time: 0 hours 10 minutes 7 seconds  Total Procedure Duration: 0 hours 16 minutes 14 seconds  Findings:                 The perianal and digital rectal examinations were                            normal.                           A 2 mm polyp was found in the transverse colon. The                            polyp was sessile. The polyp was removed with a                            cold biopsy forceps. Resection and retrieval were  complete.                           Non-bleeding internal hemorrhoids were found during                            retroflexion. The hemorrhoids were small.                           A patchy area of mild melanosis was found in the                            transverse colon, in the ascending colon and in the                            cecum.                           The exam was otherwise without abnormality. Complications:            No immediate complications. Estimated Blood Loss:     Estimated blood loss was minimal. Impression:               - One 2 mm polyp in the transverse colon, removed                            with a cold biopsy forceps. Resected and retrieved.                           - Non-bleeding internal hemorrhoids.                           - Melanosis in the colon.                           - The examination was otherwise normal. Recommendation:           - Patient has a contact  number available for                            emergencies. The signs and symptoms of potential                            delayed complications were discussed with the                            patient. Return to normal activities tomorrow.                            Written discharge instructions were provided to the                            patient.                           - Resume previous diet.                           -  Continue present medications.                           - Await pathology results.                           - Repeat colonoscopy in 5 years for surveillance                            based on pathology results. Mauri Pole, MD 12/31/2018 8:48:28 AM This report has been signed electronically.

## 2019-01-04 ENCOUNTER — Telehealth: Payer: Self-pay | Admitting: *Deleted

## 2019-01-04 NOTE — Telephone Encounter (Signed)
1. Have you developed a fever since your procedure? no  2.   Have you had an respiratory symptoms (SOB or cough) since your procedure? no  3.   Have you tested positive for COVID 19 since your procedure no  4.   Have you had any family members/close contacts diagnosed with the COVID 19 since your procedure?  No  Patient stating her husband developed a fever last evening, no fever today. Patient aware to call us if husband does test positive. Patient agreed   If yes to any of these questions please route to Joylene John, RN and Alphonsa Gin, RN.  Follow up Call-  Call back number 12/31/2018  Post procedure Call Back phone  # [phone]  Permission to leave phone message Yes  Some recent data might be hidden     Patient questions:  Do you have a fever, pain , or abdominal swelling? No. Pain Score  0 *  Have you tolerated food without any problems? Yes.    Have you been able to return to your normal activities? Yes.    Do you have any questions about your discharge instructions: Diet   No. Medications  No. Follow up visit  No.  Do you have questions or concerns about your Care? No.  Actions: * If pain score is 4 or above: No action needed, pain <4.

## 2019-01-05 ENCOUNTER — Encounter: Payer: BLUE CROSS/BLUE SHIELD | Admitting: Gastroenterology

## 2019-01-08 ENCOUNTER — Encounter: Payer: Self-pay | Admitting: Gastroenterology

## 2019-01-25 DIAGNOSIS — E7801 Familial hypercholesterolemia: Secondary | ICD-10-CM | POA: Diagnosis not present

## 2019-01-25 DIAGNOSIS — F321 Major depressive disorder, single episode, moderate: Secondary | ICD-10-CM | POA: Diagnosis not present

## 2019-01-25 DIAGNOSIS — D126 Benign neoplasm of colon, unspecified: Secondary | ICD-10-CM | POA: Diagnosis not present

## 2019-01-25 DIAGNOSIS — E1040 Type 1 diabetes mellitus with diabetic neuropathy, unspecified: Secondary | ICD-10-CM | POA: Diagnosis not present

## 2019-02-25 DIAGNOSIS — D2271 Melanocytic nevi of right lower limb, including hip: Secondary | ICD-10-CM | POA: Diagnosis not present

## 2019-02-25 DIAGNOSIS — D2272 Melanocytic nevi of left lower limb, including hip: Secondary | ICD-10-CM | POA: Diagnosis not present

## 2019-02-25 DIAGNOSIS — L820 Inflamed seborrheic keratosis: Secondary | ICD-10-CM | POA: Diagnosis not present

## 2019-02-25 DIAGNOSIS — D692 Other nonthrombocytopenic purpura: Secondary | ICD-10-CM | POA: Diagnosis not present

## 2019-02-25 DIAGNOSIS — Z85828 Personal history of other malignant neoplasm of skin: Secondary | ICD-10-CM | POA: Diagnosis not present

## 2019-02-26 DIAGNOSIS — E109 Type 1 diabetes mellitus without complications: Secondary | ICD-10-CM | POA: Diagnosis not present

## 2019-03-16 DIAGNOSIS — M0609 Rheumatoid arthritis without rheumatoid factor, multiple sites: Secondary | ICD-10-CM | POA: Diagnosis not present

## 2019-03-30 DIAGNOSIS — Z4681 Encounter for fitting and adjustment of insulin pump: Secondary | ICD-10-CM | POA: Diagnosis not present

## 2019-03-30 DIAGNOSIS — E038 Other specified hypothyroidism: Secondary | ICD-10-CM | POA: Diagnosis not present

## 2019-03-30 DIAGNOSIS — E1040 Type 1 diabetes mellitus with diabetic neuropathy, unspecified: Secondary | ICD-10-CM | POA: Diagnosis not present

## 2019-04-17 DIAGNOSIS — Z6823 Body mass index (BMI) 23.0-23.9, adult: Secondary | ICD-10-CM | POA: Diagnosis not present

## 2019-04-17 DIAGNOSIS — Z20828 Contact with and (suspected) exposure to other viral communicable diseases: Secondary | ICD-10-CM | POA: Diagnosis not present

## 2019-04-26 DIAGNOSIS — M2550 Pain in unspecified joint: Secondary | ICD-10-CM | POA: Diagnosis not present

## 2019-04-26 DIAGNOSIS — R5382 Chronic fatigue, unspecified: Secondary | ICD-10-CM | POA: Diagnosis not present

## 2019-04-26 DIAGNOSIS — M0609 Rheumatoid arthritis without rheumatoid factor, multiple sites: Secondary | ICD-10-CM | POA: Diagnosis not present

## 2019-07-20 DIAGNOSIS — E1040 Type 1 diabetes mellitus with diabetic neuropathy, unspecified: Secondary | ICD-10-CM | POA: Diagnosis not present

## 2019-07-20 DIAGNOSIS — Z0000 Encounter for general adult medical examination without abnormal findings: Secondary | ICD-10-CM | POA: Diagnosis not present

## 2019-07-20 DIAGNOSIS — E039 Hypothyroidism, unspecified: Secondary | ICD-10-CM | POA: Diagnosis not present

## 2019-07-20 DIAGNOSIS — E7801 Familial hypercholesterolemia: Secondary | ICD-10-CM | POA: Diagnosis not present

## 2019-07-27 DIAGNOSIS — M79671 Pain in right foot: Secondary | ICD-10-CM | POA: Diagnosis not present

## 2019-07-27 DIAGNOSIS — E7801 Familial hypercholesterolemia: Secondary | ICD-10-CM | POA: Diagnosis not present

## 2019-07-27 DIAGNOSIS — R82998 Other abnormal findings in urine: Secondary | ICD-10-CM | POA: Diagnosis not present

## 2019-07-27 DIAGNOSIS — Z1331 Encounter for screening for depression: Secondary | ICD-10-CM | POA: Diagnosis not present

## 2019-07-27 DIAGNOSIS — M0600 Rheumatoid arthritis without rheumatoid factor, unspecified site: Secondary | ICD-10-CM | POA: Diagnosis not present

## 2019-07-27 DIAGNOSIS — E1040 Type 1 diabetes mellitus with diabetic neuropathy, unspecified: Secondary | ICD-10-CM | POA: Diagnosis not present

## 2019-07-27 DIAGNOSIS — D126 Benign neoplasm of colon, unspecified: Secondary | ICD-10-CM | POA: Diagnosis not present

## 2019-07-27 DIAGNOSIS — I8310 Varicose veins of unspecified lower extremity with inflammation: Secondary | ICD-10-CM | POA: Diagnosis not present

## 2019-07-27 DIAGNOSIS — Z0000 Encounter for general adult medical examination without abnormal findings: Secondary | ICD-10-CM | POA: Diagnosis not present

## 2019-08-04 DIAGNOSIS — E7801 Familial hypercholesterolemia: Secondary | ICD-10-CM | POA: Diagnosis not present

## 2019-08-04 DIAGNOSIS — Z794 Long term (current) use of insulin: Secondary | ICD-10-CM | POA: Diagnosis not present

## 2019-08-04 DIAGNOSIS — Z4681 Encounter for fitting and adjustment of insulin pump: Secondary | ICD-10-CM | POA: Diagnosis not present

## 2019-08-04 DIAGNOSIS — E1040 Type 1 diabetes mellitus with diabetic neuropathy, unspecified: Secondary | ICD-10-CM | POA: Diagnosis not present

## 2019-08-27 DIAGNOSIS — Z03818 Encounter for observation for suspected exposure to other biological agents ruled out: Secondary | ICD-10-CM | POA: Diagnosis not present

## 2019-08-27 DIAGNOSIS — Z20828 Contact with and (suspected) exposure to other viral communicable diseases: Secondary | ICD-10-CM | POA: Diagnosis not present

## 2019-08-31 DIAGNOSIS — Z20828 Contact with and (suspected) exposure to other viral communicable diseases: Secondary | ICD-10-CM | POA: Diagnosis not present

## 2019-08-31 DIAGNOSIS — Z03818 Encounter for observation for suspected exposure to other biological agents ruled out: Secondary | ICD-10-CM | POA: Diagnosis not present

## 2019-09-30 DIAGNOSIS — E109 Type 1 diabetes mellitus without complications: Secondary | ICD-10-CM | POA: Diagnosis not present

## 2019-10-01 DIAGNOSIS — E1065 Type 1 diabetes mellitus with hyperglycemia: Secondary | ICD-10-CM | POA: Diagnosis not present

## 2019-10-22 DIAGNOSIS — Z6823 Body mass index (BMI) 23.0-23.9, adult: Secondary | ICD-10-CM | POA: Diagnosis not present

## 2019-10-22 DIAGNOSIS — R5382 Chronic fatigue, unspecified: Secondary | ICD-10-CM | POA: Diagnosis not present

## 2019-10-22 DIAGNOSIS — M0609 Rheumatoid arthritis without rheumatoid factor, multiple sites: Secondary | ICD-10-CM | POA: Diagnosis not present

## 2019-10-22 DIAGNOSIS — M2550 Pain in unspecified joint: Secondary | ICD-10-CM | POA: Diagnosis not present

## 2019-10-28 DIAGNOSIS — M79671 Pain in right foot: Secondary | ICD-10-CM | POA: Diagnosis not present

## 2019-11-01 DIAGNOSIS — Z20822 Contact with and (suspected) exposure to covid-19: Secondary | ICD-10-CM | POA: Diagnosis not present

## 2019-11-17 DIAGNOSIS — M25562 Pain in left knee: Secondary | ICD-10-CM | POA: Diagnosis not present

## 2019-11-17 DIAGNOSIS — M79671 Pain in right foot: Secondary | ICD-10-CM | POA: Diagnosis not present

## 2019-11-29 DIAGNOSIS — Z20822 Contact with and (suspected) exposure to covid-19: Secondary | ICD-10-CM | POA: Diagnosis not present

## 2019-12-01 DIAGNOSIS — S92354D Nondisplaced fracture of fifth metatarsal bone, right foot, subsequent encounter for fracture with routine healing: Secondary | ICD-10-CM | POA: Diagnosis not present

## 2019-12-01 DIAGNOSIS — S92344D Nondisplaced fracture of fourth metatarsal bone, right foot, subsequent encounter for fracture with routine healing: Secondary | ICD-10-CM | POA: Diagnosis not present

## 2019-12-07 DIAGNOSIS — E1040 Type 1 diabetes mellitus with diabetic neuropathy, unspecified: Secondary | ICD-10-CM | POA: Diagnosis not present

## 2020-01-17 DIAGNOSIS — Z6824 Body mass index (BMI) 24.0-24.9, adult: Secondary | ICD-10-CM | POA: Diagnosis not present

## 2020-01-17 DIAGNOSIS — Z01419 Encounter for gynecological examination (general) (routine) without abnormal findings: Secondary | ICD-10-CM | POA: Diagnosis not present

## 2020-01-17 DIAGNOSIS — Z1231 Encounter for screening mammogram for malignant neoplasm of breast: Secondary | ICD-10-CM | POA: Diagnosis not present

## 2020-01-25 DIAGNOSIS — M62838 Other muscle spasm: Secondary | ICD-10-CM | POA: Diagnosis not present

## 2020-01-25 DIAGNOSIS — R350 Frequency of micturition: Secondary | ICD-10-CM | POA: Diagnosis not present

## 2020-01-25 DIAGNOSIS — K5900 Constipation, unspecified: Secondary | ICD-10-CM | POA: Diagnosis not present

## 2020-01-25 DIAGNOSIS — R351 Nocturia: Secondary | ICD-10-CM | POA: Diagnosis not present

## 2020-01-25 DIAGNOSIS — M6281 Muscle weakness (generalized): Secondary | ICD-10-CM | POA: Diagnosis not present

## 2020-01-25 DIAGNOSIS — R152 Fecal urgency: Secondary | ICD-10-CM | POA: Diagnosis not present

## 2020-02-01 DIAGNOSIS — K5900 Constipation, unspecified: Secondary | ICD-10-CM | POA: Diagnosis not present

## 2020-02-01 DIAGNOSIS — M6281 Muscle weakness (generalized): Secondary | ICD-10-CM | POA: Diagnosis not present

## 2020-02-01 DIAGNOSIS — M0600 Rheumatoid arthritis without rheumatoid factor, unspecified site: Secondary | ICD-10-CM | POA: Diagnosis not present

## 2020-02-01 DIAGNOSIS — R350 Frequency of micturition: Secondary | ICD-10-CM | POA: Diagnosis not present

## 2020-02-01 DIAGNOSIS — E7801 Familial hypercholesterolemia: Secondary | ICD-10-CM | POA: Diagnosis not present

## 2020-02-01 DIAGNOSIS — M62838 Other muscle spasm: Secondary | ICD-10-CM | POA: Diagnosis not present

## 2020-02-01 DIAGNOSIS — R152 Fecal urgency: Secondary | ICD-10-CM | POA: Diagnosis not present

## 2020-02-01 DIAGNOSIS — E1040 Type 1 diabetes mellitus with diabetic neuropathy, unspecified: Secondary | ICD-10-CM | POA: Diagnosis not present

## 2020-02-02 DIAGNOSIS — H40003 Preglaucoma, unspecified, bilateral: Secondary | ICD-10-CM | POA: Diagnosis not present

## 2020-02-08 DIAGNOSIS — M6281 Muscle weakness (generalized): Secondary | ICD-10-CM | POA: Diagnosis not present

## 2020-02-08 DIAGNOSIS — M62838 Other muscle spasm: Secondary | ICD-10-CM | POA: Diagnosis not present

## 2020-02-08 DIAGNOSIS — K5900 Constipation, unspecified: Secondary | ICD-10-CM | POA: Diagnosis not present

## 2020-02-08 DIAGNOSIS — R152 Fecal urgency: Secondary | ICD-10-CM | POA: Diagnosis not present

## 2020-02-08 DIAGNOSIS — R350 Frequency of micturition: Secondary | ICD-10-CM | POA: Diagnosis not present

## 2020-02-08 DIAGNOSIS — R351 Nocturia: Secondary | ICD-10-CM | POA: Diagnosis not present

## 2020-02-15 DIAGNOSIS — K5901 Slow transit constipation: Secondary | ICD-10-CM | POA: Diagnosis not present

## 2020-02-15 DIAGNOSIS — M6281 Muscle weakness (generalized): Secondary | ICD-10-CM | POA: Diagnosis not present

## 2020-02-15 DIAGNOSIS — M62838 Other muscle spasm: Secondary | ICD-10-CM | POA: Diagnosis not present

## 2020-02-15 DIAGNOSIS — R351 Nocturia: Secondary | ICD-10-CM | POA: Diagnosis not present

## 2020-02-15 DIAGNOSIS — R3915 Urgency of urination: Secondary | ICD-10-CM | POA: Diagnosis not present

## 2020-02-15 DIAGNOSIS — R152 Fecal urgency: Secondary | ICD-10-CM | POA: Diagnosis not present

## 2020-02-15 DIAGNOSIS — R350 Frequency of micturition: Secondary | ICD-10-CM | POA: Diagnosis not present

## 2020-02-17 DIAGNOSIS — E1065 Type 1 diabetes mellitus with hyperglycemia: Secondary | ICD-10-CM | POA: Diagnosis not present

## 2020-02-17 DIAGNOSIS — E109 Type 1 diabetes mellitus without complications: Secondary | ICD-10-CM | POA: Diagnosis not present

## 2020-02-22 DIAGNOSIS — M62838 Other muscle spasm: Secondary | ICD-10-CM | POA: Diagnosis not present

## 2020-02-22 DIAGNOSIS — M6281 Muscle weakness (generalized): Secondary | ICD-10-CM | POA: Diagnosis not present

## 2020-02-22 DIAGNOSIS — R152 Fecal urgency: Secondary | ICD-10-CM | POA: Diagnosis not present

## 2020-02-22 DIAGNOSIS — K5900 Constipation, unspecified: Secondary | ICD-10-CM | POA: Diagnosis not present

## 2020-02-22 DIAGNOSIS — R350 Frequency of micturition: Secondary | ICD-10-CM | POA: Diagnosis not present

## 2020-03-02 DIAGNOSIS — D2372 Other benign neoplasm of skin of left lower limb, including hip: Secondary | ICD-10-CM | POA: Diagnosis not present

## 2020-03-02 DIAGNOSIS — Z85828 Personal history of other malignant neoplasm of skin: Secondary | ICD-10-CM | POA: Diagnosis not present

## 2020-03-02 DIAGNOSIS — D225 Melanocytic nevi of trunk: Secondary | ICD-10-CM | POA: Diagnosis not present

## 2020-03-02 DIAGNOSIS — L570 Actinic keratosis: Secondary | ICD-10-CM | POA: Diagnosis not present

## 2020-03-02 DIAGNOSIS — D692 Other nonthrombocytopenic purpura: Secondary | ICD-10-CM | POA: Diagnosis not present

## 2020-03-14 DIAGNOSIS — M62838 Other muscle spasm: Secondary | ICD-10-CM | POA: Diagnosis not present

## 2020-03-14 DIAGNOSIS — M6281 Muscle weakness (generalized): Secondary | ICD-10-CM | POA: Diagnosis not present

## 2020-03-14 DIAGNOSIS — R350 Frequency of micturition: Secondary | ICD-10-CM | POA: Diagnosis not present

## 2020-03-14 DIAGNOSIS — K5900 Constipation, unspecified: Secondary | ICD-10-CM | POA: Diagnosis not present

## 2020-03-14 DIAGNOSIS — R152 Fecal urgency: Secondary | ICD-10-CM | POA: Diagnosis not present

## 2020-03-21 DIAGNOSIS — E1040 Type 1 diabetes mellitus with diabetic neuropathy, unspecified: Secondary | ICD-10-CM | POA: Diagnosis not present

## 2020-03-28 DIAGNOSIS — R152 Fecal urgency: Secondary | ICD-10-CM | POA: Diagnosis not present

## 2020-03-28 DIAGNOSIS — M6281 Muscle weakness (generalized): Secondary | ICD-10-CM | POA: Diagnosis not present

## 2020-03-28 DIAGNOSIS — R350 Frequency of micturition: Secondary | ICD-10-CM | POA: Diagnosis not present

## 2020-03-28 DIAGNOSIS — K5900 Constipation, unspecified: Secondary | ICD-10-CM | POA: Diagnosis not present

## 2020-03-28 DIAGNOSIS — M62838 Other muscle spasm: Secondary | ICD-10-CM | POA: Diagnosis not present

## 2020-05-01 ENCOUNTER — Encounter: Payer: Self-pay | Admitting: Oncology

## 2020-05-01 ENCOUNTER — Telehealth: Payer: Self-pay | Admitting: Oncology

## 2020-05-01 NOTE — Telephone Encounter (Signed)
Called to discuss with patient about COVID-19 symptoms and the use of one of the available treatments for those with mild to moderate Covid symptoms and at a high risk of hospitalization.  Pt appears to qualify for outpatient treatment due to co-morbid conditions and/or a member of an at-risk group in accordance with the FDA Emergency Use Authorization.    Symptom onset: 04/23/20 Vaccinated: No Booster? No Immunocompromised? No Qualifiers:  Past Medical History:  Diagnosis Date  . Abnormality of gait 11/11/2013  . Allergy    AS A CHILD,WHEAT,MSG,MILK  . Arthritis    HANDS,FEET,SPINE  . Basal cell carcinoma (BCC)    2-3  . Cataract    BILATERAL-BEGINNIG STAGE  . Depression   . Family history of breast cancer   . Family history of colon cancer   . Family history of melanoma   . Family history of stomach cancer   . GERD (gastroesophageal reflux disease)   . Heart murmur   . Hiatal hernia   . Hypercholesterolemia   . Muscle cramps 11/11/2013  . Polyneuropathy in other diseases classified elsewhere ([place]) 01/06/2014  . Skin cancer of arm, right    SQUAMOUS CELL -R FORE ARM  . Type 1 diabetes ([place])      Spoke with patient and unfortunately her symptoms started greater than 7 days ago.  She is feeling much better.  Dawn Lewis

## 2022-12-15 DIAGNOSIS — M810 Age-related osteoporosis without current pathological fracture: Secondary | ICD-10-CM

## 2022-12-15 HISTORY — DX: Age-related osteoporosis without current pathological fracture: M81.0

## 2023-07-16 ENCOUNTER — Encounter: Payer: Self-pay | Admitting: Physician Assistant

## 2023-08-14 DIAGNOSIS — E039 Hypothyroidism, unspecified: Secondary | ICD-10-CM

## 2023-08-14 HISTORY — DX: Hypothyroidism, unspecified: E03.9

## 2023-09-17 ENCOUNTER — Ambulatory Visit
Admission: RE | Admit: 2023-09-17 | Discharge: 2023-09-17 | Disposition: A | Discharge status: Home / Self Care | Source: Ambulatory Visit | Attending: Physician Assistant

## 2023-09-17 ENCOUNTER — Ambulatory Visit: Admitting: Physician Assistant

## 2023-09-17 ENCOUNTER — Encounter: Payer: Self-pay | Admitting: Physician Assistant

## 2023-09-17 VITALS — BP 136/84 | HR 64 | Ht 70.0 in | Wt 175.6 lb

## 2023-09-17 DIAGNOSIS — K5909 Other constipation: Secondary | ICD-10-CM | POA: Diagnosis not present

## 2023-09-17 DIAGNOSIS — R140 Abdominal distension (gaseous): Secondary | ICD-10-CM | POA: Diagnosis not present

## 2023-09-17 DIAGNOSIS — Z800 Family history of malignant neoplasm of digestive organs: Secondary | ICD-10-CM | POA: Diagnosis not present

## 2023-09-17 DIAGNOSIS — K5900 Constipation, unspecified: Secondary | ICD-10-CM | POA: Diagnosis not present

## 2023-09-17 MED ORDER — LINACLOTIDE 145 MCG PO CAPS: 145.0000 ug | ORAL_CAPSULE | Freq: Every day | ORAL | Status: DC

## 2023-09-17 NOTE — Patient Instructions (Addendum)
 Your provider has requested that you have an abdominal x ray before leaving today. Please go to the basement floor to our Radiology department for the test.  We have given you samples of the following medication to take: Linzess 145 mcg daily before breakfast.  _______________________________________________________  If your blood pressure at your visit was 140/90 or greater, please contact your primary care physician to follow up on this.  _______________________________________________________  If you are age 28 or older, your body mass index should be between 23-30. Your Body mass index is 25.2 kg/m. If this is out of the aforementioned range listed, please consider follow up with your Primary Care Provider.  If you are age 44 or younger, your body mass index should be between 19-25. Your Body mass index is 25.2 kg/m. If this is out of the aformentioned range listed, please consider follow up with your Primary Care Provider.   ________________________________________________________  The [facility] providers would like to encourage you to use MYCHART to communicate with providers for non-urgent requests or questions.  Due to long hold times on the telephone, sending your provider a message by [facility] may be a faster and more efficient way to get a response.  Please allow 48 business hours for a response.  Please remember that this is for non-urgent requests.  _______________________________________________________

## 2023-09-17 NOTE — Progress Notes (Signed)
 Chief Complaint: Chronic constipation with diarrhea  HPI:    This is Andrew is a 68 year old female with a past medical history as listed below including family history of colon cancer, known to Dr. Leonia Raman, who was referred to me by Tisovec, Kristina Pfeiffer, MD for a complaint of constipation and some diarrhea.      12/31/2018 colonoscopy with one 2 mm polyp in the transverse colon, nonbleeding internal hemorrhoids and melanosis in the colon.  Repeat recommended in 5 years given family history.    Today, the patient tells me she was actually seeing her OB/GYN who had recently started her on Alendronate back in the fall of last year which caused severe diarrhea.  When she went back to see them they stopped the Alendronate in October but she discussed her bowel habits at the time and her OB/GYN was concerned.  She tells me she has chronic constipation and was diagnosed with "spastic colon" in the 60s.  Apparently controlled with Robinul in the past.  Now she has a new symptom of diarrhea.  Tells me she will have 1 day of diarrhea in the week and other times a small rabbit pellets are does not come out at all.  This is regardless of using chronic herbal lax and magnesium in the evenings which she has been doing for years.  Tells me sometimes when she will have a bowel movement she develops a pressure headache from straining.  She tries not to strain.    Denies fever, chills, weight loss or blood in her stool.  Past Medical History:  Diagnosis Date   Abnormality of gait 11/11/2013   Allergy    AS A CHILD,WHEAT,MSG,MILK   Arthritis    HANDS,FEET,SPINE   Basal cell carcinoma (BCC)    2-3   Cataract    BILATERAL-BEGINNIG STAGE   Depression    Family history of breast cancer    Family history of colon cancer    Family history of melanoma    Family history of stomach cancer    GERD (gastroesophageal reflux disease)    Heart murmur    Hiatal hernia    Hypercholesterolemia    Muscle cramps 11/11/2013    Polyneuropathy in other diseases classified elsewhere ([facility]) 01/06/2014   Skin cancer of arm, right    SQUAMOUS CELL -R FORE ARM   Type 1 diabetes ([facility])     Past Surgical History:  Procedure Laterality Date   APPENDECTOMY     CHOLECYSTECTOMY     KNEE ARTHROSCOPY     left meniscus repair   MANDIBLE SURGERY     extension   PARTIAL HYSTERECTOMY     tail bone repair     Coccyx fracture, subsequent resection   TONSILLECTOMY      Current Outpatient Medications  Medication Sig Dispense Refill   Alpha Lipoic Acid 200 MG CAPS Take 1 capsule by mouth 2 (two) times daily.     CALCIUM CITRATE PO Take by mouth.     cetirizine (ZYRTEC) 10 MG tablet Take 10 mg by mouth daily.     Cholecalciferol (VITAMIN D3 PO) Take by mouth.     Coenzyme Q10 (CO Q 10) 100 MG CAPS Take 1 capsule by mouth daily at 6 (six) AM.     COLLAGEN PO Take 6,000 mg by mouth daily at 6 (six) AM.     diclofenac sodium (VOLTAREN) 1 % GEL Apply topically. TAKING AS NEEDED     estradiol (ESTRACE) 0.1 MG/GM vaginal cream Place  1 Applicatorful vaginally at bedtime.     Evening Primrose Oil 500 MG CAPS Take 2 capsules by mouth daily.     gabapentin (NEURONTIN) 100 MG capsule Take 1 capsule by mouth at bedtime.     glucosamine-chondroitin 500-400 MG tablet Take 3 tablets by mouth daily.      hydroxychloroquine (PLAQUENIL) 200 MG tablet Take 200 mg by mouth 2 (two) times daily.     levothyroxine (SYNTHROID) 25 MCG tablet Take 25 mcg by mouth daily before breakfast.     linaclotide (LINZESS) 145 MCG CAPS capsule Take 1 capsule (145 mcg total) by mouth daily before breakfast.     lisinopril (PRINIVIL,ZESTRIL) 20 MG tablet Take 10 mg by mouth daily.     Magnesium 400 MG TABS Take 400 mg by mouth daily.      metroNIDAZOLE (METROGEL) 1 % gel Apply topically daily.     Misc Natural Products (TART CHERRY ADVANCED) CAPS Take 4 capsules by mouth daily.     Multiple Vitamin (MULTIVITAMIN) tablet Take 1 tablet by mouth 2 (two) times  daily.     NOVOLOG 100 UNIT/ML injection Inject 100 Units into the skin daily. Insulin  pump range 26units-30 units     Omega-3 Fatty Acids (OMEGA 3 PO) Take 2,000 mg by mouth daily.      ONE TOUCH ULTRA TEST test strip 1 each by Other route daily.     OVER THE COUNTER MEDICATION      OVER THE COUNTER MEDICATION      OVER THE COUNTER MEDICATION      PRESCRIPTION MEDICATION Novolog insulin  via PUMP for DM (28U daily on average)     rosuvastatin (CRESTOR) 20 MG tablet Take 1 tablet by mouth daily.     sertraline (ZOLOFT) 50 MG tablet Take 100 mg by mouth daily.     terconazole (TERAZOL 7) 0.4 % vaginal cream Place 1 applicator vaginally as needed. Daily at bedtime as needed     Turmeric (QC TUMERIC COMPLEX) 500 MG CAPS Take by mouth.     No current facility-administered medications for this visit.    Allergies as of 09/17/2023 - Review Complete 12/31/2018  Allergen Reaction Noted   Aspirin Anaphylaxis and Hives 09/27/2008   Nsaids Hives 10/29/2013   Statins Other (See Comments) 05/23/2016    Family History  Problem Relation Age of Onset   Stroke Mother    Breast cancer Mother        dx in 8's   Thyroid  cancer Mother        dx in 44's   Colon cancer Father        dx in 46's. had part of colon removed and had many polyp after cancer dx.    Colon cancer Maternal Grandfather        dx 50's/60's.   Cancer Paternal Grandmother        dx. 63's. stomach/colon cancer- exact origin unk, died in her 76's   Colon cancer Paternal Grandfather        dx 38's-60's, died in his 37's   Esophageal cancer Maternal Uncle        dx 61's. hx smoking (maybe)   Melanoma Maternal Uncle        dx 70's multiple melanomas and BCC's   Breast cancer Cousin    Melanoma Maternal Uncle        dx 70's   Basal cell carcinoma Cousin        several, is now in 42's   Stomach  cancer Other        dx and died in 60's/60's   Cancer Other        abdominal/stomach cancer, age dx unk   Stomach cancer Other     Colon cancer Other 50   Breast cancer Other        dx 70's   Breast cancer Other        dx. >60   Rectal cancer Neg Hx     Social History   Socioeconomic History   Marital status: Married    Spouse name: Currie Douse   Number of children: 3   Years of education: MA   Highest education level: Not on file  Occupational History   Occupation: Housewife  Tobacco Use   Smoking status: Never   Smokeless tobacco: Never  Vaping Use   Vaping status: Former  Substance and Sexual Activity   Alcohol use: No    Alcohol/week: 0.0 standard drinks of alcohol   Drug use: No   Sexual activity: Not on file  Other Topics Concern   Not on file  Social History Narrative   Patient lives at home with her husband (Rondald) and two of her children.   Patient is a care giver.    Financial controller .   Right handed.   Caffeine two sodas daily.   Social Drivers of Corporate investment banker Strain: Not on file  Food Insecurity: Not on file  Transportation Needs: Not on file  Physical Activity: Not on file  Stress: Not on file  Social Connections: Not on file  Intimate Partner Violence: Not on file    Review of Systems:    Constitutional: No weight loss, fever or chills Skin: No rash  Cardiovascular: No chest pain  Respiratory: No SOB  Gastrointestinal: See HPI and otherwise negative Genitourinary: No dysuria  Neurological: No headache, dizziness or syncope Musculoskeletal: No new muscle or joint pain Hematologic: No bleeding  Psychiatric: No history of depression or anxiety   Physical Exam:  Vital signs: BP 136/84   Pulse 64   Ht 5\' 10"  (1.778 m)   Wt 175 lb 9.6 oz (79.7 kg)   BMI 25.20 kg/m    Constitutional:   Pleasant Caucasian female appears to be in NAD, Well developed, Well nourished, alert and cooperative Head:  Normocephalic and atraumatic. Eyes:   PEERL, EOMI. No icterus. Conjunctiva pink. Ears:  Normal auditory acuity. Neck:  Supple Throat: Oral cavity and pharynx  without inflammation, swelling or lesion.  Respiratory: Respirations even and unlabored. Lungs clear to auscultation bilaterally.   No wheezes, crackles, or rhonchi.  Cardiovascular: Normal S1, S2. No MRG. Regular rate and rhythm. No peripheral edema, cyanosis or pallor.  Gastrointestinal:  Soft, nondistended, nontender. No rebound or guarding. Normal bowel sounds. No appreciable masses or hepatomegaly. Rectal:  Not performed.  Msk:  Symmetrical without gross deformities. Without edema, no deformity or joint abnormality.  Neurologic:  Alert and  oriented x4;  grossly normal neurologically.  Skin:   Dry and intact without significant lesions or rashes. Psychiatric:  Demonstrates good judgement and reason without abnormal affect or behaviors.  RELEVANT LABS AND IMAGING: CBC    Component Value Date/Time   WBC 4.9 01/20/2011 1955   RBC 4.93 01/20/2011 1955   HGB 14.8 01/20/2011 1955   HCT 40.7 01/20/2011 1955   PLT 171 01/20/2011 1955   MCV 82.6 01/20/2011 1955   MCH 30.0 01/20/2011 1955   MCHC 36.4 (H) 01/20/2011 1955   RDW  11.9 01/20/2011 1955   LYMPHSABS 1.5 01/20/2011 1955   MONOABS 0.4 01/20/2011 1955   EOSABS 0.1 01/20/2011 1955   BASOSABS 0.0 01/20/2011 1955    CMP     Component Value Date/Time   NA 139 01/06/2014 1305   K 3.9 01/06/2014 1305   CL 97 01/06/2014 1305   CO2 24 01/06/2014 1305   GLUCOSE 173 (H) 01/06/2014 1305   GLUCOSE 229 (H) 01/21/2011 0840   BUN 9 01/06/2014 1305   CREATININE 0.79 01/06/2014 1305   CALCIUM 9.0 01/06/2014 1305   PROT 6.2 01/06/2014 1305   ALBUMIN 4.2 01/06/2014 1305   AST 23 01/06/2014 1305   ALT 18 01/06/2014 1305   ALKPHOS 61 01/06/2014 1305   BILITOT 0.4 01/06/2014 1305   GFRNONAA 84 01/06/2014 1305   GFRAA 97 01/06/2014 1305    Assessment: 1.  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  Family history of colon cancer: Patient due for repeat colonoscopy in September of this year  Plan: 1.  Ordered two-view abdominal x-ray.  Pending results would recommend a MiraLAX bowel purge first. 2.  Discussed Linzess 145 mcg daily 30 minutes before a meal.  Gave her some samples.  Would also need a prescription if this x-ray shows constipation. 3.  Discussed her next colonoscopy is due in September, we can schedule this at follow-up in a couple of months. 4.  Patient to follow in clinic with me in 2 months.  Reginal Capra, PA-C [facility] 09/17/2023, 3:18 PM  Cc: Tisovec, Kristina Pfeiffer, MD

## 2023-09-24 ENCOUNTER — Ambulatory Visit: Payer: Self-pay | Admitting: Physician Assistant

## 2023-09-24 ENCOUNTER — Other Ambulatory Visit: Payer: Self-pay | Admitting: *Deleted

## 2023-09-24 MED ORDER — LINACLOTIDE 72 MCG PO CAPS: 72.0000 ug | ORAL_CAPSULE | Freq: Every day | ORAL | 1 refills | Status: AC

## 2023-11-17 ENCOUNTER — Encounter: Payer: Self-pay | Admitting: Physician Assistant

## 2023-11-17 ENCOUNTER — Ambulatory Visit: Admitting: Physician Assistant

## 2023-11-17 VITALS — BP 122/68 | HR 71 | Ht 70.0 in | Wt 175.0 lb

## 2023-11-17 DIAGNOSIS — Z800 Family history of malignant neoplasm of digestive organs: Secondary | ICD-10-CM | POA: Diagnosis not present

## 2023-11-17 DIAGNOSIS — K5909 Other constipation: Secondary | ICD-10-CM | POA: Diagnosis not present

## 2023-11-17 DIAGNOSIS — K581 Irritable bowel syndrome with constipation: Secondary | ICD-10-CM

## 2023-11-17 DIAGNOSIS — Z860100 Personal history of colon polyps, unspecified: Secondary | ICD-10-CM

## 2023-11-17 MED ORDER — NA SULFATE-K SULFATE-MG SULF 17.5-3.13-1.6 GM/177ML PO SOLN
1.0000 | Freq: Once | ORAL | 0 refills | Status: AC
Start: 2023-11-17 — End: 2023-11-17

## 2023-11-17 NOTE — Patient Instructions (Signed)
 Try Linzess  72 mcg daily before breakfast.   You have been scheduled for a colonoscopy. Please follow written instructions given to you at your visit today.   If you use inhalers (even only as needed), please bring them with you on the day of your procedure.  DO NOT TAKE 7 DAYS PRIOR TO TEST- Trulicity (dulaglutide) Ozempic, Wegovy (semaglutide) Mounjaro (tirzepatide) Bydureon Bcise (exanatide extended release)  DO NOT TAKE 1 DAY PRIOR TO YOUR TEST Rybelsus (semaglutide) Adlyxin (lixisenatide) Victoza (liraglutide) Byetta (exanatide) _______________________________________________________________________  _______________________________________________________  If your blood pressure at your visit was 140/90 or greater, please contact your primary care physician to follow up on this.  _______________________________________________________  If you are age 67 or older, your body mass index should be between 23-30. Your Body mass index is 25.11 kg/m. If this is out of the aforementioned range listed, please consider follow up with your Primary Care Provider.  If you are age 31 or younger, your body mass index should be between 19-25. Your Body mass index is 25.11 kg/m. If this is out of the aformentioned range listed, please consider follow up with your Primary Care Provider.   ________________________________________________________  The [facility] providers would like to encourage you to use MYCHART to communicate with providers for non-urgent requests or questions.  Due to long hold times on the telephone, sending your provider a message by [facility] may be a faster and more efficient way to get a response.  Please allow 48 business hours for a response.  Please remember that this is for non-urgent requests.  _______________________________________________________  [facility] is using a team-based approach to care.  Your team is made up of your doctor and two to three  APPS. Our APPS (Nurse Practitioners and Physician Assistants) work with your physician to ensure care continuity for you. They are fully qualified to address your health concerns and develop a treatment plan. They communicate directly with your gastroenterologist to care for you. Seeing the Advanced Practice Practitioners on your physician's team can help you by facilitating care more promptly, often allowing for earlier appointments, access to diagnostic testing, procedures, and other specialty referrals.

## 2023-11-17 NOTE — Progress Notes (Signed)
 Chief Complaint: Follow up Constipation  HPI:    Dawn Lewis is a 68 year old female with a past medical history as listed below including a previous hysterectomy for endometriosis, insulin -dependent diabetes, family history of colon cancer, known to Dr. Shila, who returns to clinic today for follow-up of her chronic constipation.      12/31/2018 colonoscopy with one 2 mm polyp in the transverse colon nonbleeding internal hemorrhoids and melanosis.  Repeat recommended in 5 years given family history.    09/02/2023 CMP with a glucose of 308 and otherwise normal, CBC with a white count of 3.8 and otherwise normal.    09/17/23 patient seen in clinic and at that time discussed chronic constipation and spastic colon, at that time having a day of diarrhea and then other times small rabbit pellets.  She was on chronic herbal lax and magnesium.  At that point recommended Linzess  145 mcg daily.  Also discussed she is due for colonoscopy in September.  Ordered a two-view abdominal x-ray.    09/23/2023 x-ray showed mild retained fecal material without obstructive change.  She was told to do a MiraLAX bowel purge and start Linzess .  She was given 145 mcg.  Patient called and told us  that 145 mcg was too strong.  She was sent in 72 mcg.    Today, the patient tells me she has been too nervous to try the Linzess  72 mcg given the extreme amount of diarrhea that she had on trial of the last Linzess .  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  She has increased her herbal lax to twice daily and still uses magnesium, only getting rabbit pellets and continues to have to strain.    Denies fever, chills or weight loss.    Past Medical History:  Diagnosis Date   Abnormality of gait 11/11/2013   Allergy    AS A CHILD,WHEAT,MSG,MILK   Arthritis    HANDS,FEET,SPINE   Basal cell carcinoma (BCC)     2-3   Cataract    BILATERAL-BEGINNIG STAGE   Depression    Family history of breast cancer    Family history of colon cancer    Family history of melanoma    Family history of stomach cancer    GERD (gastroesophageal reflux disease)    Heart murmur    Hiatal hernia    Hypercholesterolemia    Muscle cramps 11/11/2013   Polyneuropathy in other diseases classified elsewhere (HCC) 01/06/2014   Skin cancer of arm, right    SQUAMOUS CELL -R FORE ARM   Type 1 diabetes (HCC)     Past Surgical History:  Procedure Laterality Date   APPENDECTOMY     CHOLECYSTECTOMY     KNEE ARTHROSCOPY     left meniscus repair   MANDIBLE SURGERY     extension   PARTIAL HYSTERECTOMY     tail bone repair     Coccyx fracture, subsequent resection   TONSILLECTOMY      Current Outpatient Medications  Medication Sig Dispense Refill   Alpha Lipoic Acid 200 MG CAPS Take 1 capsule by mouth 2 (two) times daily.     CALCIUM CITRATE PO Take by mouth.     cetirizine (ZYRTEC) 10 MG tablet Take 10 mg by mouth daily.     Cholecalciferol (VITAMIN D3 PO) Take by mouth.  Coenzyme Q10 (CO Q 10) 100 MG CAPS Take 1 capsule by mouth daily at 6 (six) AM.     COLLAGEN PO Take 6,000 mg by mouth daily at 6 (six) AM.     diclofenac sodium (VOLTAREN) 1 % GEL Apply topically. TAKING AS NEEDED     estradiol (ESTRACE) 0.1 MG/GM vaginal cream Place 1 Applicatorful vaginally at bedtime.     Evening Primrose Oil 500 MG CAPS Take 2 capsules by mouth daily.     gabapentin (NEURONTIN) 100 MG capsule Take 1 capsule by mouth at bedtime.     glucosamine-chondroitin 500-400 MG tablet Take 3 tablets by mouth daily.      hydroxychloroquine (PLAQUENIL) 200 MG tablet Take 200 mg by mouth 2 (two) times daily.     levothyroxine (SYNTHROID) 25 MCG tablet Take 25 mcg by mouth daily before breakfast.     linaclotide  (LINZESS ) 72 MCG capsule Take 1 capsule (72 mcg total) by mouth daily before breakfast. 30 capsule 1   lisinopril  (PRINIVIL,ZESTRIL) 20 MG tablet Take 10 mg by mouth daily.     Magnesium 400 MG TABS Take 400 mg by mouth daily.      metroNIDAZOLE (METROGEL) 1 % gel Apply topically daily.     Misc Natural Products (TART CHERRY ADVANCED) CAPS Take 4 capsules by mouth daily.     Multiple Vitamin (MULTIVITAMIN) tablet Take 1 tablet by mouth 2 (two) times daily.     NOVOLOG 100 UNIT/ML injection Inject 100 Units into the skin daily. Insulin  pump range 26units-30 units     Omega-3 Fatty Acids (OMEGA 3 PO) Take 2,000 mg by mouth daily.      ONE TOUCH ULTRA TEST test strip 1 each by Other route daily.     OVER THE COUNTER MEDICATION      OVER THE COUNTER MEDICATION      OVER THE COUNTER MEDICATION      PRESCRIPTION MEDICATION Novolog insulin  via PUMP for DM (28U daily on average)     rosuvastatin (CRESTOR) 20 MG tablet Take 1 tablet by mouth daily.     sertraline (ZOLOFT) 50 MG tablet Take 100 mg by mouth daily.     terconazole (TERAZOL 7) 0.4 % vaginal cream Place 1 applicator vaginally as needed. Daily at bedtime as needed     Turmeric (QC TUMERIC COMPLEX) 500 MG CAPS Take by mouth.     No current facility-administered medications for this visit.    Allergies as of 11/17/2023 - Review Complete 11/17/2023  Allergen Reaction Noted   Aspirin Anaphylaxis and Hives 09/27/2008   Nsaids Hives 10/29/2013   Statins Other (See Comments) 05/23/2016    Family History  Problem Relation Age of Onset   Stroke Mother    Breast cancer Mother        dx in 13's   Thyroid  cancer Mother        dx in 9's   Colon cancer Father        dx in 55's. had part of colon removed and had many polyp after cancer dx.    Colon cancer Maternal Grandfather        dx 50's/60's.   Cancer Paternal Grandmother        dx. 68's. stomach/colon cancer- exact origin unk, died in her 30's   Colon cancer Paternal Grandfather        dx 53's-60's, died in his 63's   Esophageal cancer Maternal Uncle        dx 60's. hx smoking (  maybe)    Melanoma Maternal Uncle        dx 70's multiple melanomas and BCC's   Breast cancer Cousin    Melanoma Maternal Uncle        dx 70's   Basal cell carcinoma Cousin        several, is now in 56's   Stomach cancer Other        dx and died in 22's/60's   Cancer Other        abdominal/stomach cancer, age dx unk   Stomach cancer Other    Colon cancer Other 50   Breast cancer Other        dx 29's   Breast cancer Other        dx. >60   Rectal cancer Neg Hx     Social History   Socioeconomic History   Marital status: Married    Spouse name: Tanda   Number of children: 3   Years of education: MA   Highest education level: Not on file  Occupational History   Occupation: Housewife  Tobacco Use   Smoking status: Never   Smokeless tobacco: Never  Vaping Use   Vaping status: Former  Substance and Sexual Activity   Alcohol use: No    Alcohol/week: 0.0 standard drinks of alcohol   Drug use: No   Sexual activity: Not on file  Other Topics Concern   Not on file  Social History Narrative   Patient lives at home with her husband (Rondald) and two of her children.   Patient is a care giver.    Financial controller .   Right handed.   Caffeine two sodas daily.   Social Drivers of Corporate investment banker Strain: Not on file  Food Insecurity: Not on file  Transportation Needs: Not on file  Physical Activity: Not on file  Stress: Not on file  Social Connections: Not on file  Intimate Partner Violence: Not on file    Review of Systems:    Constitutional: No weight loss, fever or chills Cardiovascular: No chest pain Respiratory: No SOB  Gastrointestinal: See HPI and otherwise negative   Physical Exam:  Vital signs: BP 122/68   Pulse 71   Ht 5' 10 (1.778 m)   Wt 175 lb (79.4 kg)   BMI 25.11 kg/m    Constitutional:   Pleasant Caucasian female appears to be in NAD, Well developed, Well nourished, alert and cooperative Respiratory: Respirations even and unlabored. Lungs  clear to auscultation bilaterally.   No wheezes, crackles, or rhonchi.  Cardiovascular: Normal S1, S2. No MRG. Regular rate and rhythm. No peripheral edema, cyanosis or pallor.  Gastrointestinal:  Soft, nondistended, nontender. No rebound or guarding. Decreased BS all four quadrants. No appreciable masses or hepatomegaly. Rectal:  Not performed.  Psychiatric: Demonstrates good judgement and reason without abnormal affect or behaviors.  No recent labs.  Assessment: 1.  Chronic constipation: Linzess  145 mcg daily was too strong for her, she has been prescribed Linzess  72 mcg now but was nervous to try it, recommend she do so, also due for colonoscopy as below; IBS-C 2.  Family history of colon cancer: Patient due for colonoscopy as of September, the last in 2020 with 1 polyp and repeat recommended in 5 years  Plan: 1.  Most recent labs in from May 2025 reviewed. 2.  Scheduled patient for a surveillance colonoscopy in the [facility] given family history of colon cancer.  Did provide the patient a detailed list  of risks for the procedure and she agrees to proceed.  Would recommend 2-day bowel prep given history of constipation. 3.  Recommend the patient trial Linzess  72 mcg.  This is too strong for her could trial Amitiza 8 mcg and maybe even take it once a day. 4.  Patient to follow in clinic per recommendations from Dr. Shila after time of procedure.  Delon Failing, PA-C [facility] 11/17/2023, 3:47 PM  Cc: Tisovec, Charlie ORN, MD

## 2024-01-05 ENCOUNTER — Ambulatory Visit (AMBULATORY_SURGERY_CENTER): Admitting: Gastroenterology

## 2024-01-05 ENCOUNTER — Encounter: Payer: Self-pay | Admitting: Gastroenterology

## 2024-01-05 VITALS — BP 147/67 | HR 77 | Temp 97.9°F | Resp 13 | Ht 70.0 in | Wt 175.0 lb

## 2024-01-05 DIAGNOSIS — K644 Residual hemorrhoidal skin tags: Secondary | ICD-10-CM

## 2024-01-05 DIAGNOSIS — Z1211 Encounter for screening for malignant neoplasm of colon: Secondary | ICD-10-CM

## 2024-01-05 DIAGNOSIS — D120 Benign neoplasm of cecum: Secondary | ICD-10-CM | POA: Diagnosis not present

## 2024-01-05 DIAGNOSIS — Z860100 Personal history of colon polyps, unspecified: Secondary | ICD-10-CM

## 2024-01-05 DIAGNOSIS — Z800 Family history of malignant neoplasm of digestive organs: Secondary | ICD-10-CM

## 2024-01-05 DIAGNOSIS — D123 Benign neoplasm of transverse colon: Secondary | ICD-10-CM

## 2024-01-05 DIAGNOSIS — K648 Other hemorrhoids: Secondary | ICD-10-CM

## 2024-01-05 DIAGNOSIS — Z860101 Personal history of adenomatous and serrated colon polyps: Secondary | ICD-10-CM | POA: Diagnosis not present

## 2024-01-05 MED ORDER — SODIUM CHLORIDE 0.9 % IV SOLN: 500.0000 mL | Freq: Once | INTRAVENOUS | Status: DC

## 2024-01-05 NOTE — Progress Notes (Signed)
 Called to room to assist during endoscopic procedure.  Patient ID and intended procedure confirmed with present staff. Received instructions for my participation in the procedure from the performing physician.

## 2024-01-05 NOTE — Patient Instructions (Signed)
 Handouts given: Polyps, Hemorrhoids Resume previous diet. Continue present medications.  Await pathology results. Repeat colonoscopy in 5 years for surveillance based on pathology results.   YOU HAD AN ENDOSCOPIC PROCEDURE TODAY: Refer to the procedure report and other information in the discharge instructions given to you for any specific questions about what was found during the examination. If this information does not answer your questions, please call [place] office at [phone] to clarify.   YOU SHOULD EXPECT: Some feelings of bloating in the abdomen. Passage of more gas than usual. Walking can help get rid of the air that was put into your GI tract during the procedure and reduce the bloating. If you had a lower endoscopy (such as a colonoscopy or flexible sigmoidoscopy) you may notice spotting of blood in your stool or on the toilet paper. Some abdominal soreness may be present for a day or two, also.  DIET: Your first meal following the procedure should be a light meal and then it is ok to progress to your normal diet. A half-sandwich or bowl of soup is an example of a good first meal. Heavy or fried foods are harder to digest and may make you feel nauseous or bloated. Drink plenty of fluids but you should avoid alcoholic beverages for 24 hours. If you had a esophageal dilation, please see attached instructions for diet.    ACTIVITY: Your care partner should take you home directly after the procedure. You should plan to take it easy, moving slowly for the rest of the day. You can resume normal activity the day after the procedure however YOU SHOULD NOT DRIVE, use power tools, machinery or perform tasks that involve climbing or major physical exertion for 24 hours (because of the sedation medicines used during the test).   SYMPTOMS TO REPORT IMMEDIATELY: A gastroenterologist can be reached at any hour. Please call [phone]  for any of the following symptoms:  Following lower endoscopy  (colonoscopy, flexible sigmoidoscopy) Excessive amounts of blood in the stool  Significant tenderness, worsening of abdominal pains  Swelling of the abdomen that is new, acute  Fever of 100 or higher   FOLLOW UP:  If any biopsies were taken you will be contacted by phone or by letter within the next 1-3 weeks. Call [phone]  if you have not heard about the biopsies in 3 weeks.  Please also call with any specific questions about appointments or follow up tests.

## 2024-01-05 NOTE — Progress Notes (Signed)
 [facility] History and Physical   Primary Care Physician:  Tisovec, Charlie ORN, MD   Reason for Procedure:  History of adenomatous colon polyps, family history of colon cancer  Plan:    Surveillance colonoscopy with possible interventions as needed     HPI: Dawn Lewis is a very pleasant 68 y.o. female here for surveillance colonoscopy. Denies any nausea, vomiting, abdominal pain, melena or bright red blood per rectum  The risks and benefits as well as alternatives of endoscopic procedure(s) have been discussed and reviewed. All questions answered. The patient agrees to proceed.    Past Medical History:  Diagnosis Date   Abnormality of gait 11/11/2013   Allergy    AS A CHILD,WHEAT,MSG,MILK   Arthritis    HANDS,FEET,SPINE   Basal cell carcinoma (BCC)    2-3   Cataract    BILATERAL-BEGINNIG STAGE   Depression    Family history of breast cancer    Family history of colon cancer    Family history of melanoma    Family history of stomach cancer    GERD (gastroesophageal reflux disease)    Heart murmur    Hiatal hernia    Hypercholesterolemia    Muscle cramps 11/11/2013   Polyneuropathy in other diseases classified elsewhere (HCC) 01/06/2014   Skin cancer of arm, right    SQUAMOUS CELL -R FORE ARM   Type 1 diabetes (HCC)     Past Surgical History:  Procedure Laterality Date   APPENDECTOMY     CHOLECYSTECTOMY     KNEE ARTHROSCOPY     left meniscus repair   MANDIBLE SURGERY     extension   PARTIAL HYSTERECTOMY     tail bone repair     Coccyx fracture, subsequent resection   TONSILLECTOMY      Prior to Admission medications   Medication Sig Start Date End Date Taking? Authorizing Provider  Alpha Lipoic Acid 200 MG CAPS Take 1 capsule by mouth 2 (two) times daily.    [provider]  CALCIUM CITRATE PO Take by mouth.    [provider]  cetirizine (ZYRTEC) 10 MG tablet Take 10 mg by mouth daily.    [provider]   Cholecalciferol (VITAMIN D3 PO) Take by mouth.    [provider]  Coenzyme Q10 (CO Q 10) 100 MG CAPS Take 1 capsule by mouth daily at 6 (six) AM.    [provider]  COLLAGEN PO Take 6,000 mg by mouth daily at 6 (six) AM.    [provider]  diclofenac sodium (VOLTAREN) 1 % GEL Apply topically. TAKING AS NEEDED    [provider]  estradiol (ESTRACE) 0.1 MG/GM vaginal cream Place 1 Applicatorful vaginally at bedtime.    [provider]  Evening Primrose Oil 500 MG CAPS Take 2 capsules by mouth daily.    [provider]  gabapentin (NEURONTIN) 100 MG capsule Take 1 capsule by mouth at bedtime. 05/01/22   [provider]  glucosamine-chondroitin 500-400 MG tablet Take 3 tablets by mouth daily.     [provider]  hydroxychloroquine (PLAQUENIL) 200 MG tablet Take 200 mg by mouth 2 (two) times daily.    [provider]  levothyroxine (SYNTHROID) 25 MCG tablet Take 25 mcg by mouth daily before breakfast.    [provider]  linaclotide  (LINZESS ) 72 MCG capsule Take 1 capsule (72 mcg total) by mouth daily before breakfast. 09/24/23   Beather Delon Gibson, PA  lisinopril (PRINIVIL,ZESTRIL) 20 MG tablet  Take 10 mg by mouth daily.    [provider]  Magnesium 400 MG TABS Take 400 mg by mouth daily.     [provider]  metroNIDAZOLE (METROGEL) 1 % gel Apply topically daily.    [provider]  Misc Natural Products (TART CHERRY ADVANCED) CAPS Take 4 capsules by mouth daily.    [provider]  Multiple Vitamin (MULTIVITAMIN) tablet Take 1 tablet by mouth 2 (two) times daily.    [provider]  NOVOLOG 100 UNIT/ML injection Inject 100 Units into the skin daily. Insulin  pump range 26units-30 units 11/11/13   [provider]  Omega-3 Fatty Acids (OMEGA 3 PO) Take 2,000 mg by mouth daily.     [provider]  ONE TOUCH ULTRA TEST test strip 1 each by Other  route daily. 11/11/13   [provider]  OVER THE COUNTER MEDICATION     [provider]  OVER THE COUNTER MEDICATION     [provider]  OVER THE COUNTER MEDICATION     [provider]  PRESCRIPTION MEDICATION Novolog insulin  via PUMP for DM (28U daily on average)    [provider]  rosuvastatin (CRESTOR) 20 MG tablet Take 1 tablet by mouth daily.    [provider]  sertraline (ZOLOFT) 50 MG tablet Take 100 mg by mouth daily.    [provider]  terconazole (TERAZOL 7) 0.4 % vaginal cream Place 1 applicator vaginally as needed. Daily at bedtime as needed    [provider]  Turmeric (QC TUMERIC COMPLEX) 500 MG CAPS Take by mouth.    [provider]    Current Outpatient Medications  Medication Sig Dispense Refill   Alpha Lipoic Acid 200 MG CAPS Take 1 capsule by mouth 2 (two) times daily.     CALCIUM CITRATE PO Take by mouth.     cetirizine (ZYRTEC) 10 MG tablet Take 10 mg by mouth daily.     Cholecalciferol (VITAMIN D3 PO) Take by mouth.     Coenzyme Q10 (CO Q 10) 100 MG CAPS Take 1 capsule by mouth daily at 6 (six) AM.     COLLAGEN PO Take 6,000 mg by mouth daily at 6 (six) AM.     diclofenac sodium (VOLTAREN) 1 % GEL Apply topically. TAKING AS NEEDED     estradiol (ESTRACE) 0.1 MG/GM vaginal cream Place 1 Applicatorful vaginally at bedtime.     Evening Primrose Oil 500 MG CAPS Take 2 capsules by mouth daily.     gabapentin (NEURONTIN) 100 MG capsule Take 1 capsule by mouth at bedtime.     glucosamine-chondroitin 500-400 MG tablet Take 3 tablets by mouth daily.      hydroxychloroquine (PLAQUENIL) 200 MG tablet Take 200 mg by mouth 2 (two) times daily.     levothyroxine (SYNTHROID) 25 MCG tablet Take 25 mcg by mouth daily before breakfast.     linaclotide  (LINZESS ) 72 MCG capsule Take 1 capsule (72 mcg total) by mouth daily before breakfast. 30 capsule 1   lisinopril (PRINIVIL,ZESTRIL) 20 MG tablet Take  10 mg by mouth daily.     Magnesium 400 MG TABS Take 400 mg by mouth daily.      metroNIDAZOLE (METROGEL) 1 % gel Apply topically daily.     Misc Natural Products (TART CHERRY ADVANCED) CAPS Take 4 capsules by mouth daily.     Multiple Vitamin (MULTIVITAMIN) tablet Take 1 tablet by mouth 2 (two) times daily.     NOVOLOG 100  UNIT/ML injection Inject 100 Units into the skin daily. Insulin  pump range 26units-30 units     Omega-3 Fatty Acids (OMEGA 3 PO) Take 2,000 mg by mouth daily.      ONE TOUCH ULTRA TEST test strip 1 each by Other route daily.     OVER THE COUNTER MEDICATION      OVER THE COUNTER MEDICATION      OVER THE COUNTER MEDICATION      PRESCRIPTION MEDICATION Novolog insulin  via PUMP for DM (28U daily on average)     rosuvastatin (CRESTOR) 20 MG tablet Take 1 tablet by mouth daily.     sertraline (ZOLOFT) 50 MG tablet Take 100 mg by mouth daily.     terconazole (TERAZOL 7) 0.4 % vaginal cream Place 1 applicator vaginally as needed. Daily at bedtime as needed     Turmeric (QC TUMERIC COMPLEX) 500 MG CAPS Take by mouth.     No current facility-administered medications for this visit.    Allergies as of 01/05/2024 - Review Complete 01/05/2024  Allergen Reaction Noted   Aspirin Anaphylaxis and Hives 09/27/2008   Nsaids Hives 10/29/2013   Statins Other (See Comments) 05/23/2016    Family History  Problem Relation Age of Onset   Stroke Mother    Breast cancer Mother        dx in 21's   Thyroid  cancer Mother        dx in 1's   Colon cancer Father        dx in 69's. had part of colon removed and had many polyp after cancer dx.    Colon cancer Maternal Grandfather        dx 50's/60's.   Cancer Paternal Grandmother        dx. 74's. stomach/colon cancer- exact origin unk, died in her 87's   Colon cancer Paternal Grandfather        dx 75's-60's, died in his 26's   Esophageal cancer Maternal Uncle        dx 31's. hx smoking (maybe)   Melanoma Maternal Uncle        dx 70's  multiple melanomas and BCC's   Breast cancer Cousin    Melanoma Maternal Uncle        dx 70's   Basal cell carcinoma Cousin        several, is now in 61's   Stomach cancer Other        dx and died in 22's/60's   Cancer Other        abdominal/stomach cancer, age dx unk   Stomach cancer Other    Colon cancer Other 50   Breast cancer Other        dx 56's   Breast cancer Other        dx. >60   Rectal cancer Neg Hx     Social History   Socioeconomic History   Marital status: Married    Spouse name: Tanda   Number of children: 3   Years of education: MA   Highest education level: Not on file  Occupational History   Occupation: Housewife  Tobacco Use   Smoking status: Never   Smokeless tobacco: Never  Vaping Use   Vaping status: Former  Substance and Sexual Activity   Alcohol use: No    Alcohol/week: 0.0 standard drinks of alcohol   Drug use: No   Sexual activity: Not on file  Other Topics Concern   Not on file  Social History Narrative  Patient lives at home with her husband (Rondald) and two of her children.   Patient is a care giver.    Financial controller .   Right handed.   Caffeine two sodas daily.   Social Drivers of Corporate investment banker Strain: Not on file  Food Insecurity: Not on file  Transportation Needs: Not on file  Physical Activity: Not on file  Stress: Not on file  Social Connections: Not on file  Intimate Partner Violence: Not on file    Review of Systems:  All other review of systems negative except as mentioned in the HPI.  Physical Exam: Vital signs in last 24 hours: BP 106/61   Pulse 67   Temp 97.9 F (36.6 C) (Temporal)   Ht 5' 10 (1.778 m)   Wt 175 lb (79.4 kg)   SpO2 96%   BMI 25.11 kg/m  General:   Alert, NAD Lungs:  Clear .   Heart:  Regular rate and rhythm Abdomen:  Soft, nontender and nondistended. Neuro/Psych:  Alert and cooperative. Normal mood and affect. A and O x 3  Reviewed labs, radiology imaging, old  records and pertinent past GI work up  Patient is appropriate for planned procedure(s) and anesthesia in an ambulatory setting   K. Veena Judas Mohammad , MD [phone]

## 2024-01-05 NOTE — Op Note (Signed)
 [facility] Patient Name: Dawn Lewis Procedure Date: 01/05/2024 1:11 PM MRN: [id number] Endoscopist: Gustav ALONSO Mcgee , MD, [id number] Age: 68 Referring MD:  Date of Birth: Apr 06, 1956 Gender: Female Account #: [id number] Procedure:                Colonoscopy Indications:              Screening in patient at increased risk: Family                            history of 1st-degree relative with colorectal                            cancer, High risk colon cancer surveillance:                            Personal history of adenoma less than 10 mm in size Medicines:                Monitored Anesthesia Care Procedure:                Pre-Anesthesia Assessment:                           - Prior to the procedure, a History and Physical                            was performed, and patient medications and                            allergies were reviewed. The patient's tolerance of                            previous anesthesia was also reviewed. The risks                            and benefits of the procedure and the sedation                            options and risks were discussed with the patient.                            All questions were answered, and informed consent                            was obtained. Prior Anticoagulants: The patient has                            taken no anticoagulant or antiplatelet agents. ASA                            Grade Assessment: III - A patient with severe                            systemic disease. After reviewing the risks and  benefits, the patient was deemed in satisfactory                            condition to undergo the procedure.                           After obtaining informed consent, the colonoscope                            was passed under direct vision. Throughout the                            procedure, the patient's blood pressure, pulse, and                            oxygen  saturations were monitored continuously. The                            Olympus Scope [id number] was introduced through the                            anus and advanced to the the cecum, identified by                            appendiceal orifice and ileocecal valve. The                            colonoscopy was performed without difficulty. The                            patient tolerated the procedure well. The quality                            of the bowel preparation was adequate. The                            ileocecal valve, appendiceal orifice, and rectum                            were photographed. Scope In: 1:41:21 PM Scope Out: 1:56:29 PM Scope Withdrawal Time: 0 hours 11 minutes 29 seconds  Total Procedure Duration: 0 hours 15 minutes 8 seconds  Findings:                 The perianal and digital rectal examinations were                            normal.                           A 1 mm polyp was found in the cecum. The polyp was                            sessile. The polyp was removed with a cold biopsy  forceps. Resection and retrieval were complete.                           A 7 mm polyp was found in the transverse colon. The                            polyp was sessile. The polyp was removed with a                            cold snare. Resection and retrieval were complete.                           Non-bleeding external and internal hemorrhoids were                            found during retroflexion. The hemorrhoids were                            medium-sized. Complications:            No immediate complications. Estimated Blood Loss:     Estimated blood loss was minimal. Impression:               - One 1 mm polyp in the cecum, removed with a cold                            biopsy forceps. Resected and retrieved.                           - One 7 mm polyp in the transverse colon, removed                            with a cold snare.  Resected and retrieved.                           - Non-bleeding external and internal hemorrhoids. Recommendation:           - Patient has a contact number available for                            emergencies. The signs and symptoms of potential                            delayed complications were discussed with the                            patient. Return to normal activities tomorrow.                            Written discharge instructions were provided to the                            patient.                           -  Resume previous diet.                           - Continue present medications.                           - Await pathology results.                           - Repeat colonoscopy in 5 years for surveillance                            based on pathology results. Avamae Dehaan V. Khaleah Duer, MD 01/05/2024 2:02:29 PM This report has been signed electronically.

## 2024-01-05 NOTE — Progress Notes (Signed)
 Vss nad trans to pacu

## 2024-01-06 ENCOUNTER — Telehealth: Payer: Self-pay

## 2024-01-06 NOTE — Telephone Encounter (Signed)
  Follow up Call-     01/05/2024   12:58 PM  Call back number  Post procedure Call Back phone  # [phone]  Permission to leave phone message Yes     Patient questions:  Do you have a fever, pain , or abdominal swelling? No. Pain Score  0 *  Have you tolerated food without any problems? Yes.    Have you been able to return to your normal activities? Yes.    Do you have any questions about your discharge instructions: Diet   No. Medications  No. Follow up visit  No.  Do you have questions or concerns about your Care? No.  Actions: * If pain score is 4 or above: No action needed, pain <4.

## 2024-01-08 LAB — SURGICAL PATHOLOGY

## 2024-01-23 ENCOUNTER — Ambulatory Visit: Payer: Self-pay | Admitting: Gastroenterology

## 2024-02-14 ENCOUNTER — Emergency Department (HOSPITAL_COMMUNITY)

## 2024-02-14 ENCOUNTER — Encounter (HOSPITAL_COMMUNITY): Admission: EM | Disposition: A | Discharge status: Home / Self Care | Payer: Self-pay | Source: Ambulatory Visit

## 2024-02-14 ENCOUNTER — Encounter (HOSPITAL_COMMUNITY): Payer: Self-pay

## 2024-02-14 ENCOUNTER — Encounter (HOSPITAL_COMMUNITY)
Admission: EM | Disposition: A | Discharge status: Home / Self Care | Payer: Self-pay | Source: Home / Self Care | Attending: Emergency Medicine

## 2024-02-14 ENCOUNTER — Encounter (HOSPITAL_COMMUNITY): Payer: Self-pay | Admitting: Orthopedic Surgery

## 2024-02-14 ENCOUNTER — Ambulatory Visit (HOSPITAL_COMMUNITY)
Admission: EM | Admit: 2024-02-14 | Discharge: 2024-02-14 | Disposition: A | Discharge status: Home / Self Care | Attending: Orthopedic Surgery | Admitting: Orthopedic Surgery

## 2024-02-14 ENCOUNTER — Emergency Department (HOSPITAL_COMMUNITY)
Admission: EM | Admit: 2024-02-14 | Discharge: 2024-02-14 | Disposition: A | Discharge status: Home / Self Care | Source: Home / Self Care | Attending: Emergency Medicine | Admitting: Emergency Medicine

## 2024-02-14 ENCOUNTER — Other Ambulatory Visit: Payer: Self-pay

## 2024-02-14 ENCOUNTER — Emergency Department (HOSPITAL_COMMUNITY): Admitting: Anesthesiology

## 2024-02-14 ENCOUNTER — Inpatient Hospital Stay: Admission: RE | Admit: 2024-02-14 | Source: Ambulatory Visit | Admitting: Orthopedic Surgery

## 2024-02-14 DIAGNOSIS — Y92010 Kitchen of single-family (private) house as the place of occurrence of the external cause: Secondary | ICD-10-CM | POA: Insufficient documentation

## 2024-02-14 DIAGNOSIS — S52571B Other intraarticular fracture of lower end of right radius, initial encounter for open fracture type I or II: Secondary | ICD-10-CM | POA: Insufficient documentation

## 2024-02-14 DIAGNOSIS — E108 Type 1 diabetes mellitus with unspecified complications: Secondary | ICD-10-CM

## 2024-02-14 DIAGNOSIS — S62101B Fracture of unspecified carpal bone, right wrist, initial encounter for open fracture: Secondary | ICD-10-CM | POA: Insufficient documentation

## 2024-02-14 DIAGNOSIS — E109 Type 1 diabetes mellitus without complications: Secondary | ICD-10-CM | POA: Insufficient documentation

## 2024-02-14 DIAGNOSIS — Z794 Long term (current) use of insulin: Secondary | ICD-10-CM | POA: Insufficient documentation

## 2024-02-14 DIAGNOSIS — I10 Essential (primary) hypertension: Secondary | ICD-10-CM | POA: Insufficient documentation

## 2024-02-14 DIAGNOSIS — W1789XA Other fall from one level to another, initial encounter: Secondary | ICD-10-CM | POA: Insufficient documentation

## 2024-02-14 DIAGNOSIS — S52601A Unspecified fracture of lower end of right ulna, initial encounter for closed fracture: Secondary | ICD-10-CM | POA: Insufficient documentation

## 2024-02-14 HISTORY — DX: Other specified postprocedural states: Z98.890

## 2024-02-14 LAB — GLUCOSE, CAPILLARY
Glucose-Capillary: 135 mg/dL — ABNORMAL HIGH (ref 70–99)
Glucose-Capillary: 185 mg/dL — ABNORMAL HIGH (ref 70–99)
Glucose-Capillary: 194 mg/dL — ABNORMAL HIGH (ref 70–99)

## 2024-02-14 LAB — CBC WITH DIFFERENTIAL/PLATELET
Abs Immature Granulocytes: 0.02 K/uL (ref 0.00–0.07)
Basophils Absolute: 0.1 K/uL (ref 0.0–0.1)
Basophils Relative: 2 %
Eosinophils Absolute: 0.2 K/uL (ref 0.0–0.5)
Eosinophils Relative: 3 %
HCT: 45 % (ref 36.0–46.0)
Hemoglobin: 15.3 g/dL — ABNORMAL HIGH (ref 12.0–15.0)
Immature Granulocytes: 0 %
Lymphocytes Relative: 36 %
Lymphs Abs: 2 K/uL (ref 0.7–4.0)
MCH: 28.8 pg (ref 26.0–34.0)
MCHC: 34 g/dL (ref 30.0–36.0)
MCV: 84.7 fL (ref 80.0–100.0)
Monocytes Absolute: 0.6 K/uL (ref 0.1–1.0)
Monocytes Relative: 11 %
Neutro Abs: 2.7 K/uL (ref 1.7–7.7)
Neutrophils Relative %: 48 %
Platelets: 234 K/uL (ref 150–400)
RBC: 5.31 MIL/uL — ABNORMAL HIGH (ref 3.87–5.11)
RDW: 11.9 % (ref 11.5–15.5)
WBC: 5.5 K/uL (ref 4.0–10.5)
nRBC: 0 % (ref 0.0–0.2)

## 2024-02-14 LAB — COMPREHENSIVE METABOLIC PANEL WITH GFR
ALT: 24 U/L (ref 0–44)
AST: 36 U/L (ref 15–41)
Albumin: 4.6 g/dL (ref 3.5–5.0)
Alkaline Phosphatase: 51 U/L (ref 38–126)
Anion gap: 10 (ref 5–15)
BUN: 11 mg/dL (ref 8–23)
CO2: 26 mmol/L (ref 22–32)
Calcium: 9.4 mg/dL (ref 8.9–10.3)
Chloride: 101 mmol/L (ref 98–111)
Creatinine, Ser: 1.07 mg/dL — ABNORMAL HIGH (ref 0.44–1.00)
GFR, Estimated: 57 mL/min — ABNORMAL LOW (ref >60)
Glucose, Bld: 155 mg/dL — ABNORMAL HIGH (ref 70–99)
Potassium: 4 mmol/L (ref 3.5–5.1)
Sodium: 138 mmol/L (ref 135–145)
Total Bilirubin: 0.8 mg/dL (ref 0.0–1.2)
Total Protein: 7 g/dL (ref 6.5–8.1)

## 2024-02-14 LAB — SURGICAL PCR SCREEN
MRSA, PCR: NEGATIVE
Staphylococcus aureus: NEGATIVE

## 2024-02-14 SURGERY — OPEN REDUCTION INTERNAL FIXATION (ORIF) DISTAL RADIUS FRACTURE
Anesthesia: General | Laterality: Right

## 2024-02-14 MED ORDER — CLONIDINE HCL (ANALGESIA) 100 MCG/ML EP SOLN
EPIDURAL | Status: DC | PRN
  Administered 2024-02-14: 50 ug

## 2024-02-14 MED ORDER — CEFAZOLIN SODIUM-DEXTROSE 2-4 GM/100ML-% IV SOLN
2.0000 g | INTRAVENOUS | Status: AC
  Administered 2024-02-14: 2 g via INTRAVENOUS

## 2024-02-14 MED ORDER — CEFAZOLIN SODIUM-DEXTROSE 2-4 GM/100ML-% IV SOLN
2.0000 g | Freq: Once | INTRAVENOUS | Status: AC
  Administered 2024-02-14: 2 g via INTRAVENOUS
  Filled 2024-02-14: qty 100

## 2024-02-14 MED ORDER — ONDANSETRON HCL 4 MG/2ML IJ SOLN
4.0000 mg | Freq: Once | INTRAMUSCULAR | Status: AC
  Administered 2024-02-14: 4 mg via INTRAVENOUS
  Filled 2024-02-14: qty 2

## 2024-02-14 MED ORDER — AMISULPRIDE (ANTIEMETIC) 5 MG/2ML IV SOLN
10.0000 mg | Freq: Once | INTRAVENOUS | Status: AC
  Administered 2024-02-14: 10 mg via INTRAVENOUS

## 2024-02-14 MED ORDER — LACTATED RINGERS IV SOLN: INTRAVENOUS | Status: DC

## 2024-02-14 MED ORDER — ALBUMIN HUMAN 5 % IV SOLN: INTRAVENOUS | Status: DC | PRN

## 2024-02-14 MED ORDER — LIDOCAINE 2% (20 MG/ML) 5 ML SYRINGE
INTRAMUSCULAR | Status: DC | PRN
  Administered 2024-02-14: 60 mg via INTRAVENOUS

## 2024-02-14 MED ORDER — MIDAZOLAM HCL 2 MG/2ML IJ SOLN
INTRAMUSCULAR | Status: AC
  Filled 2024-02-14: qty 2

## 2024-02-14 MED ORDER — EPHEDRINE SULFATE-NACL 50-0.9 MG/10ML-% IV SOSY
PREFILLED_SYRINGE | INTRAVENOUS | Status: DC | PRN
  Administered 2024-02-14: 7.5 mg via INTRAVENOUS
  Administered 2024-02-14: 10 mg via INTRAVENOUS

## 2024-02-14 MED ORDER — FENTANYL CITRATE (PF) 250 MCG/5ML IJ SOLN
INTRAMUSCULAR | Status: AC
  Filled 2024-02-14: qty 5

## 2024-02-14 MED ORDER — INSULIN ASPART 100 UNIT/ML IJ SOLN: 0.0000 [IU] | INTRAMUSCULAR | Status: DC | PRN

## 2024-02-14 MED ORDER — PROPOFOL 10 MG/ML IV BOLUS
INTRAVENOUS | Status: AC
  Filled 2024-02-14: qty 20

## 2024-02-14 MED ORDER — MIDAZOLAM HCL (PF) 2 MG/2ML IJ SOLN
INTRAMUSCULAR | Status: DC | PRN
  Administered 2024-02-14 (×2): 1 mg via INTRAVENOUS

## 2024-02-14 MED ORDER — 0.9 % SODIUM CHLORIDE (POUR BTL) OPTIME
TOPICAL | Status: DC | PRN
Start: 2024-02-14 — End: 2024-02-14
  Administered 2024-02-14: 1000 mL

## 2024-02-14 MED ORDER — PROPOFOL 10 MG/ML IV BOLUS
INTRAVENOUS | Status: DC | PRN
  Administered 2024-02-14: 130 mg via INTRAVENOUS

## 2024-02-14 MED ORDER — FENTANYL CITRATE (PF) 250 MCG/5ML IJ SOLN
INTRAMUSCULAR | Status: DC | PRN
  Administered 2024-02-14: 50 ug via INTRAVENOUS

## 2024-02-14 MED ORDER — CHLORHEXIDINE GLUCONATE 0.12 % MT SOLN
15.0000 mL | Freq: Once | OROMUCOSAL | Status: AC
  Administered 2024-02-14: 15 mL via OROMUCOSAL

## 2024-02-14 MED ORDER — BUPIVACAINE HCL (PF) 0.25 % IJ SOLN
INTRAMUSCULAR | Status: AC
  Filled 2024-02-14: qty 20

## 2024-02-14 MED ORDER — ORAL CARE MOUTH RINSE: 15.0000 mL | Freq: Once | OROMUCOSAL | Status: AC

## 2024-02-14 MED ORDER — ONDANSETRON HCL 4 MG/2ML IJ SOLN
INTRAMUSCULAR | Status: DC | PRN
  Administered 2024-02-14: 4 mg via INTRAVENOUS

## 2024-02-14 MED ORDER — FENTANYL CITRATE (PF) 100 MCG/2ML IJ SOLN: 25.0000 ug | INTRAMUSCULAR | Status: DC | PRN

## 2024-02-14 MED ORDER — DEXAMETHASONE SOD PHOSPHATE PF 10 MG/ML IJ SOLN
INTRAMUSCULAR | Status: DC | PRN
  Administered 2024-02-14: 5 mg via INTRAVENOUS

## 2024-02-14 MED ORDER — HYDROMORPHONE HCL 1 MG/ML IJ SOLN
1.0000 mg | Freq: Once | INTRAMUSCULAR | Status: AC
  Administered 2024-02-14: 1 mg via INTRAVENOUS
  Filled 2024-02-14: qty 1

## 2024-02-14 MED ORDER — BUPIVACAINE-EPINEPHRINE (PF) 0.5% -1:200000 IJ SOLN
INTRAMUSCULAR | Status: DC | PRN
  Administered 2024-02-14: 20 mL via PERINEURAL

## 2024-02-14 MED ORDER — AMISULPRIDE (ANTIEMETIC) 5 MG/2ML IV SOLN
INTRAVENOUS | Status: AC
  Filled 2024-02-14: qty 4

## 2024-02-14 MED ORDER — ONDANSETRON HCL 4 MG/2ML IJ SOLN: 4.0000 mg | Freq: Once | INTRAMUSCULAR | Status: DC | PRN

## 2024-02-14 MED ORDER — MORPHINE SULFATE (PF) 4 MG/ML IV SOLN: 4.0000 mg | Freq: Once | INTRAVENOUS | Status: DC

## 2024-02-14 MED ORDER — TETANUS-DIPHTH-ACELL PERTUSSIS 5-2-15.5 LF-MCG/0.5 IM SUSP
0.5000 mL | Freq: Once | INTRAMUSCULAR | Status: AC
  Administered 2024-02-14: 0.5 mL via INTRAMUSCULAR
  Filled 2024-02-14: qty 0.5

## 2024-02-14 MED ORDER — CEFAZOLIN SODIUM-DEXTROSE 2-4 GM/100ML-% IV SOLN
INTRAVENOUS | Status: AC
  Filled 2024-02-14: qty 100

## 2024-02-14 MED ORDER — EPHEDRINE 5 MG/ML INJ
INTRAVENOUS | Status: AC
  Filled 2024-02-14: qty 5

## 2024-02-14 SURGICAL SUPPLY — 51 items
BAG COUNTER SPONGE SURGICOUNT (BAG) ×1 IMPLANT
BIT DRILL 2.2 SS TIBIAL (BIT) IMPLANT
BNDG COMPR ESMARK 4X3 LF (GAUZE/BANDAGES/DRESSINGS) ×1 IMPLANT
BNDG ELASTIC 3INX 5YD STR LF (GAUZE/BANDAGES/DRESSINGS) ×1 IMPLANT
BNDG ELASTIC 4INX 5YD STR LF (GAUZE/BANDAGES/DRESSINGS) IMPLANT
BNDG ELASTIC 4X5.8 VLCR STR LF (GAUZE/BANDAGES/DRESSINGS) ×1 IMPLANT
BNDG GAUZE DERMACEA FLUFF 4 (GAUZE/BANDAGES/DRESSINGS) ×1 IMPLANT
CANISTER SUCTION 3000ML PPV (SUCTIONS) ×1 IMPLANT
CORD BIPOLAR FORCEPS 12FT (ELECTRODE) ×1 IMPLANT
COVER SURGICAL LIGHT HANDLE (MISCELLANEOUS) ×1 IMPLANT
CUFF TOURN SGL QUICK 18X4 (TOURNIQUET CUFF) ×1 IMPLANT
CUFF TRNQT CYL 24X4X16.5-23 (TOURNIQUET CUFF) IMPLANT
DRAPE OEC MINIVIEW 54X84 (DRAPES) ×1 IMPLANT
DRAPE SURG 17X11 SM STRL (DRAPES) ×1 IMPLANT
DRSG ADAPTIC 3X8 NADH LF (GAUZE/BANDAGES/DRESSINGS) ×1 IMPLANT
GAUZE 4X4 16PLY ~~LOC~~+RFID DBL (SPONGE) ×1 IMPLANT
GAUZE SPONGE 4X4 12PLY STRL (GAUZE/BANDAGES/DRESSINGS) ×1 IMPLANT
GAUZE XEROFORM 1X8 LF (GAUZE/BANDAGES/DRESSINGS) IMPLANT
GAUZE XEROFORM 5X9 LF (GAUZE/BANDAGES/DRESSINGS) ×1 IMPLANT
GLOVE BIOGEL PI IND STRL 8.5 (GLOVE) ×1 IMPLANT
GLOVE SURG ORTHO 8.0 STRL STRW (GLOVE) ×1 IMPLANT
GOWN STRL REUS W/ TWL LRG LVL3 (GOWN DISPOSABLE) ×1 IMPLANT
GOWN STRL REUS W/ TWL XL LVL3 (GOWN DISPOSABLE) ×1 IMPLANT
KIT BASIN OR (CUSTOM PROCEDURE TRAY) ×1 IMPLANT
KIT TURNOVER KIT B (KITS) ×1 IMPLANT
KWIRE FX5X1.6XNS BN SS (WIRE) IMPLANT
NDL HYPO 25X1 1.5 SAFETY (NEEDLE) ×1 IMPLANT
NEEDLE HYPO 25X1 1.5 SAFETY (NEEDLE) ×1 IMPLANT
PACK ORTHO EXTREMITY (CUSTOM PROCEDURE TRAY) ×1 IMPLANT
PAD ARMBOARD POSITIONER FOAM (MISCELLANEOUS) ×2 IMPLANT
PAD CAST 3X4 CTTN HI CHSV (CAST SUPPLIES) IMPLANT
PAD CAST 4YDX4 CTTN HI CHSV (CAST SUPPLIES) ×1 IMPLANT
PEG LOCKING SMOOTH 2.2X20 (Screw) IMPLANT
PEG LOCKING SMOOTH 2.2X22 (Screw) IMPLANT
PLATE MEDIUM DVR RIGHT (Plate) IMPLANT
PUTTY DBM STAGRAFT PLUS 2CC (Putty) IMPLANT
SCREW LOCK 14X2.7X 3 LD TPR (Screw) IMPLANT
SCREW LOCK 20X2.7X 3 LD TPR (Screw) IMPLANT
SCREW LOCK 22X2.7X 3 LD TPR (Screw) IMPLANT
SCREW LOCK 24X2.7X3 LD THRD (Screw) IMPLANT
SCREW LOCKING 2.7X13MM (Screw) IMPLANT
SCREW LOCKING 2.7X15MM (Screw) IMPLANT
SOAP 2 % CHG 4 OZ (WOUND CARE) ×1 IMPLANT
SOLN 0.9% NACL POUR BTL 1000ML (IV SOLUTION) ×1 IMPLANT
SOLN STERILE WATER BTL 1000 ML (IV SOLUTION) ×1 IMPLANT
SPIKE FLUID TRANSFER (MISCELLANEOUS) ×1 IMPLANT
SPONGE T-LAP 4X18 ~~LOC~~+RFID (SPONGE) ×1 IMPLANT
TOWEL GREEN STERILE (TOWEL DISPOSABLE) ×1 IMPLANT
TOWEL GREEN STERILE FF (TOWEL DISPOSABLE) IMPLANT
TUBE CONNECTING 12X1/4 (SUCTIONS) ×1 IMPLANT
YANKAUER SUCT BULB TIP NO VENT (SUCTIONS) IMPLANT

## 2024-02-14 NOTE — Discharge Instructions (Signed)
 KEEP BANDAGE CLEAN AND DRY CALL OFFICE FOR F/U APPT [phone] in 13 days Rx sent to [facility], percocet, robaxin and cephalexin KEEP HAND ELEVATED ABOVE HEART OK TO APPLY ICE TO OPERATIVE AREA CONTACT OFFICE IF ANY WORSENING PAIN OR CONCERNS.

## 2024-02-14 NOTE — Anesthesia Preprocedure Evaluation (Addendum)
 Anesthesia Evaluation  Patient identified by MRN, date of birth, ID band Patient awake    Reviewed: Allergy & Precautions, NPO status , Patient's Chart, lab work & pertinent test results  History of Anesthesia Complications (+) PONV and history of anesthetic complications  Airway Mallampati: II  TM Distance: >3 FB Neck ROM: Full    Dental  (+) Teeth Intact, Dental Advisory Given   Pulmonary neg pulmonary ROS   Pulmonary exam normal breath sounds clear to auscultation       Cardiovascular hypertension, Pt. on medications Normal cardiovascular exam Rhythm:Regular Rate:Normal     Neuro/Psych  PSYCHIATRIC DISORDERS  Depression     Neuromuscular disease    GI/Hepatic Neg liver ROS, hiatal hernia,GERD  ,,  Endo/Other  diabetes, Type 1, Insulin  DependentHypothyroidism    Renal/GU negative Renal ROS     Musculoskeletal  (+) Arthritis ,  RIGHTDISTAL RADIUS FRACTURE   Abdominal   Peds  Hematology negative hematology ROS (+)   Anesthesia Other Findings Day of surgery medications reviewed with the patient.  Reproductive/Obstetrics                              Anesthesia Physical Anesthesia Plan  ASA: 2  Anesthesia Plan: General   Post-op Pain Management: Regional block*, Ofirmev IV (intra-op)* and Toradol IV (intra-op)*   Induction: Intravenous  PONV Risk Score and Plan: 3 and Midazolam, Dexamethasone and Ondansetron  Airway Management Planned: LMA  Additional Equipment:   Intra-op Plan:   Post-operative Plan: Extubation in OR  Informed Consent: I have reviewed the patients History and Physical, chart, labs and discussed the procedure including the risks, benefits and alternatives for the proposed anesthesia with the patient or authorized representative who has indicated his/her understanding and acceptance.     Dental advisory given  Plan Discussed with: CRNA  Anesthesia Plan  Comments:          Anesthesia Quick Evaluation

## 2024-02-14 NOTE — H&P (Signed)
   The note originally documented on this encounter has been moved the the encounter in which it belongs.

## 2024-02-14 NOTE — ED Notes (Signed)
 [facility] called

## 2024-02-14 NOTE — ED Notes (Incomplete)
 [facility] called

## 2024-02-14 NOTE — Anesthesia Procedure Notes (Signed)
 Procedure Name: LMA Insertion Date/Time: 02/14/2024 5:04 PM  Performed by: Hedy Jarred, CRNAPre-anesthesia Checklist: Patient identified, Emergency Drugs available, Suction available and Patient being monitored Patient Re-evaluated:Patient Re-evaluated prior to induction Oxygen Delivery Method: Circle System Utilized Preoxygenation: Pre-oxygenation with 100% oxygen Induction Type: IV induction Ventilation: Mask ventilation without difficulty LMA: LMA inserted LMA Size: 4.0 Number of attempts: 1 Airway Equipment and Method: Bite block Placement Confirmation: positive ETCO2 Tube secured with: Tape Dental Injury: Teeth and Oropharynx as per pre-operative assessment

## 2024-02-14 NOTE — ED Notes (Signed)
Report called to [facility] Charge

## 2024-02-14 NOTE — Progress Notes (Signed)
 Orthopedic Tech Progress Note Patient Details:  Dawn Lewis 09/09/1955 [id number]  Ortho Devices Type of Ortho Device: Shoulder immobilizer, Post (short arm) splint Ortho Device/Splint Location: [facility] RUE/ transfering to [facility] for surgery Ortho Device/Splint Interventions: Ordered, Application, Adjustment   Post Interventions Patient Tolerated: Well  [name] 02/14/2024, 2:40 PM

## 2024-02-14 NOTE — H&P (Signed)
 Dawn Lewis is an 68 y.o. female.   Chief Complaint: Right wrist pain HPI: PMH hypothyroid, T1DM, Osteoporosis, HLD, GERD, presenting after a fall from her kitchen counter while cleaning her windows.  She reports that she was sitting in the sink when she fell backwards landing on her right arm outstretched behind her.  She did not hit her head but did also land on her right hip.  She denies any loss of consciousness.          Past Medical History:  Diagnosis Date   Abnormality of gait 11/11/2013   Allergy      AS A CHILD,WHEAT,MSG,MILK   Arthritis      HANDS,FEET,SPINE   Basal cell carcinoma (BCC)      2-3   Cataract      BILATERAL-BEGINNIG STAGE   Depression     Family history of breast cancer     Family history of colon cancer     Family history of melanoma     Family history of stomach cancer     GERD (gastroesophageal reflux disease)     Heart murmur     Hiatal hernia     Hypercholesterolemia     Hypothyroidism 08/2023   Muscle cramps 11/11/2013   Osteoporosis     Osteoporosis 12/2022   Polyneuropathy in other diseases classified elsewhere 01/06/2014   Skin cancer of arm, right      SQUAMOUS CELL -R FORE ARM   Type 1 diabetes (HCC)                 Past Surgical History:  Procedure Laterality Date   APPENDECTOMY       CHOLECYSTECTOMY       KNEE ARTHROSCOPY        left meniscus repair   MANDIBLE SURGERY        extension   PARTIAL HYSTERECTOMY       tail bone repair        , tail bone removal   TONSILLECTOMY       TRIGGER FINGER RELEASE                   Family History  Problem Relation Age of Onset   Stroke Mother     Breast cancer Mother          dx in 64's   Thyroid  cancer Mother          dx in 50's   Colon cancer Father          dx in 30's. had part of colon removed and had many polyp after cancer dx.    Colon cancer Maternal Grandfather          dx 50's/60's.   Cancer Paternal Grandmother          dx. 55's. stomach/colon cancer- exact  origin unk, died in her 53's   Colon cancer Paternal Grandfather          dx 39's-60's, died in his 37's   Esophageal cancer Maternal Uncle          dx 63's. hx smoking (maybe)   Melanoma Maternal Uncle          dx 70's multiple melanomas and BCC's   Breast cancer Cousin     Melanoma Maternal Uncle          dx 70's   Basal cell carcinoma Cousin          several, is now in 25's  Stomach cancer Other          dx and died in 94's/60's   Cancer Other          abdominal/stomach cancer, age dx unk   Stomach cancer Other     Colon cancer Other 50   Breast cancer Other          dx 70's   Breast cancer Other          dx. >60   Rectal cancer Neg Hx          Social History:  reports that she has never smoked. She has never used smokeless tobacco. She reports that she does not drink alcohol and does not use drugs.   Allergies:  Allergies       Allergies  Allergen Reactions   Aspirin Anaphylaxis and Hives   Nsaids Hives      Can lead to anaphylactic   Statins Other (See Comments)      Leg cramps         (Not in a hospital admission)         Lab Results Last 48 Hours        Results for orders placed or performed during the hospital encounter of 02/14/24 (from the past 48 hours)  CBC with Differential     Status: Abnormal    Collection Time: 02/14/24  1:30 PM  Result Value Ref Range    WBC 5.5 4.0 - 10.5 K/uL    RBC 5.31 (H) 3.87 - 5.11 MIL/uL    Hemoglobin 15.3 (H) 12.0 - 15.0 g/dL    HCT 54.9 63.9 - 53.9 %    MCV 84.7 80.0 - 100.0 fL    MCH 28.8 26.0 - 34.0 pg    MCHC 34.0 30.0 - 36.0 g/dL    RDW 88.0 88.4 - 84.4 %    Platelets 234 150 - 400 K/uL    nRBC 0.0 0.0 - 0.2 %    Neutrophils Relative % 48 %    Neutro Abs 2.7 1.7 - 7.7 K/uL    Lymphocytes Relative 36 %    Lymphs Abs 2.0 0.7 - 4.0 K/uL    Monocytes Relative 11 %    Monocytes Absolute 0.6 0.1 - 1.0 K/uL    Eosinophils Relative 3 %    Eosinophils Absolute 0.2 0.0 - 0.5 K/uL    Basophils Relative 2 %     Basophils Absolute 0.1 0.0 - 0.1 K/uL    Immature Granulocytes 0 %    Abs Immature Granulocytes 0.02 0.00 - 0.07 K/uL      Comment: Performed at [facility], [address], [place], [place] [address]       Imaging Results (Last 48 hours)  DG Wrist 2 Views Right Result Date: 02/14/2024 CLINICAL DATA:  Fall with deformity of right wrist. EXAM: RIGHT WRIST - 2 VIEW COMPARISON:  None Available. FINDINGS: There are comminuted displaced fractures of the distal radius and ulna with dorsal displacement of the distal fracture fragments. There is intra-articular extension of the radial fracture. No dislocation is seen. Soft tissue swelling is present about the wrist. IMPRESSION: Comminuted displaced fractures of the distal radius and ulna. Electronically Signed   By: Leita Birmingham M.D.   On: 02/14/2024 14:13       ROSNO RECENT ILLNESSES OR HOSPITALIZATIONS   Blood pressure 118/66, pulse (!) 58, resp. rate (!) 22, height 5' 10 (1.778 m), weight 78.9 kg, SpO2 100%. Physical Exam  General  Appearance:  Alert, cooperative, no distress, appears stated age  Head:  Normocephalic, without obvious abnormality, atraumatic  Eyes:  Pupils equal, conjunctiva/corneas clear,             Throat: Lips, mucosa, and tongue normal; teeth and gums normal  Neck: No visible masses       Lungs:   respirations unlabored  Chest Wall:  No tenderness or deformity  Heart:  Regular rate and rhythm,  Abdomen:   Soft, non-tender,             Extremities: RUE: IN TEMPORARY SPLINT, FINGERS WARM WELL PERFUSED ABLE TO EXTEND THUMB AND WIGGLE FINGERS  Pulses: 2+ and symmetric  Skin: Skin color, texture, turgor normal, no rashes or lesions       Neurologic: Normal      Assessment/Plan RIGHT WRIST OPEN COMMINUTED DISTAL RADIUS FRACTURE   PT WITH OPEN FRACTURE EMERGENT SURGERY PLANNED FOR IRRIGATION AND DEBRIDEMENT AND OPEN STABILIZATION, ORIF VERSUS DORSAL BRIDGE PLATING    R/B/A DISCUSSED WITH PT  IN OFFICE.  PT VOICED UNDERSTANDING OF PLAN CONSENT SIGNED DAY OF SURGERY PT SEEN AND EXAMINED PRIOR TO OPERATIVE PROCEDURE/DAY OF SURGERY SITE MARKED. QUESTIONS ANSWERED WILL GO HOME FOLLOWING SURGERY    WE ARE PLANNING SURGERY FOR YOUR UPPER EXTREMITY. THE RISKS AND BENEFITS OF SURGERY INCLUDE BUT NOT LIMITED TO BLEEDING INFECTION, DAMAGE TO NEARBY NERVES ARTERIES TENDONS, FAILURE OF SURGERY TO ACCOMPLISH ITS INTENDED GOALS, PERSISTENT SYMPTOMS AND NEED FOR FURTHER SURGICAL INTERVENTION. WITH THIS IN MIND WE WILL PROCEED. I HAVE DISCUSSED WITH THE PATIENT THE PRE AND POSTOPERATIVE REGIMEN AND THE DOS AND DON'TS. PT VOICED UNDERSTANDING AND INFORMED CONSENT SIGNED.    Prentice ORN Jassen Sarver 02/14/2024, 1600

## 2024-02-14 NOTE — H&P (Signed)
 Dawn Lewis is an 68 y.o. female.   Chief Complaint: Right wrist pain HPI: PMH hypothyroid, T1DM, Osteoporosis, HLD, GERD, presenting after a fall from her kitchen counter while cleaning her windows.  She reports that she was sitting in the sink when she fell backwards landing on her right arm outstretched behind her.  She did not hit her head but did also land on her right hip.  She denies any loss of consciousness.    Past Medical History:  Diagnosis Date   Abnormality of gait 11/11/2013   Allergy    AS A CHILD,WHEAT,MSG,MILK   Arthritis    HANDS,FEET,SPINE   Basal cell carcinoma (BCC)    2-3   Cataract    BILATERAL-BEGINNIG STAGE   Depression    Family history of breast cancer    Family history of colon cancer    Family history of melanoma    Family history of stomach cancer    GERD (gastroesophageal reflux disease)    Heart murmur    Hiatal hernia    Hypercholesterolemia    Hypothyroidism 08/2023   Muscle cramps 11/11/2013   Osteoporosis    Osteoporosis 12/2022   Polyneuropathy in other diseases classified elsewhere 01/06/2014   Skin cancer of arm, right    SQUAMOUS CELL -R FORE ARM   Type 1 diabetes (HCC)     Past Surgical History:  Procedure Laterality Date   APPENDECTOMY     CHOLECYSTECTOMY     KNEE ARTHROSCOPY     left meniscus repair   MANDIBLE SURGERY     extension   PARTIAL HYSTERECTOMY     tail bone repair     , tail bone removal   TONSILLECTOMY     TRIGGER FINGER RELEASE      Family History  Problem Relation Age of Onset   Stroke Mother    Breast cancer Mother        dx in 47's   Thyroid  cancer Mother        dx in 19's   Colon cancer Father        dx in 17's. had part of colon removed and had many polyp after cancer dx.    Colon cancer Maternal Grandfather        dx 50's/60's.   Cancer Paternal Grandmother        dx. 56's. stomach/colon cancer- exact origin unk, died in her 95's   Colon cancer Paternal Grandfather        dx 56's-60's,  died in his 63's   Esophageal cancer Maternal Uncle        dx 8's. hx smoking (maybe)   Melanoma Maternal Uncle        dx 70's multiple melanomas and BCC's   Breast cancer Cousin    Melanoma Maternal Uncle        dx 70's   Basal cell carcinoma Cousin        several, is now in 11's   Stomach cancer Other        dx and died in 57's/60's   Cancer Other        abdominal/stomach cancer, age dx unk   Stomach cancer Other    Colon cancer Other 50   Breast cancer Other        dx 39's   Breast cancer Other        dx. >60   Rectal cancer Neg Hx    Social History:  reports that she has never smoked. She  has never used smokeless tobacco. She reports that she does not drink alcohol and does not use drugs.  Allergies:  Allergies  Allergen Reactions   Aspirin Anaphylaxis and Hives   Nsaids Hives    Can lead to anaphylactic   Statins Other (See Comments)    Leg cramps    (Not in a hospital admission)   Results for orders placed or performed during the hospital encounter of 02/14/24 (from the past 48 hours)  CBC with Differential     Status: Abnormal   Collection Time: 02/14/24  1:30 PM  Result Value Ref Range   WBC 5.5 4.0 - 10.5 K/uL   RBC 5.31 (H) 3.87 - 5.11 MIL/uL   Hemoglobin 15.3 (H) 12.0 - 15.0 g/dL   HCT 54.9 63.9 - 53.9 %   MCV 84.7 80.0 - 100.0 fL   MCH 28.8 26.0 - 34.0 pg   MCHC 34.0 30.0 - 36.0 g/dL   RDW 88.0 88.4 - 84.4 %   Platelets 234 150 - 400 K/uL   nRBC 0.0 0.0 - 0.2 %   Neutrophils Relative % 48 %   Neutro Abs 2.7 1.7 - 7.7 K/uL   Lymphocytes Relative 36 %   Lymphs Abs 2.0 0.7 - 4.0 K/uL   Monocytes Relative 11 %   Monocytes Absolute 0.6 0.1 - 1.0 K/uL   Eosinophils Relative 3 %   Eosinophils Absolute 0.2 0.0 - 0.5 K/uL   Basophils Relative 2 %   Basophils Absolute 0.1 0.0 - 0.1 K/uL   Immature Granulocytes 0 %   Abs Immature Granulocytes 0.02 0.00 - 0.07 K/uL    Comment: Performed at [facility], [address]  [place], [place] [address]   DG Wrist 2 Views Right Result Date: 02/14/2024 CLINICAL DATA:  Fall with deformity of right wrist. EXAM: RIGHT WRIST - 2 VIEW COMPARISON:  None Available. FINDINGS: There are comminuted displaced fractures of the distal radius and ulna with dorsal displacement of the distal fracture fragments. There is intra-articular extension of the radial fracture. No dislocation is seen. Soft tissue swelling is present about the wrist. IMPRESSION: Comminuted displaced fractures of the distal radius and ulna. Electronically Signed   By: Leita Birmingham M.D.   On: 02/14/2024 14:13    ROSNO RECENT ILLNESSES OR HOSPITALIZATIONS  Blood pressure 118/66, pulse (!) 58, resp. rate (!) 22, height 5' 10 (1.778 m), weight 78.9 kg, SpO2 100%. Physical Exam  General Appearance:  Alert, cooperative, no distress, appears stated age  Head:  Normocephalic, without obvious abnormality, atraumatic  Eyes:  Pupils equal, conjunctiva/corneas clear,         Throat: Lips, mucosa, and tongue normal; teeth and gums normal  Neck: No visible masses     Lungs:   respirations unlabored  Chest Wall:  No tenderness or deformity  Heart:  Regular rate and rhythm,  Abdomen:   Soft, non-tender,         Extremities: RUE: IN TEMPORARY SPLINT, FINGERS WARM WELL PERFUSED ABLE TO EXTEND THUMB AND WIGGLE FINGERS  Pulses: 2+ and symmetric  Skin: Skin color, texture, turgor normal, no rashes or lesions     Neurologic: Normal     Assessment/Plan RIGHT WRIST OPEN COMMINUTED DISTAL RADIUS FRACTURE  PT WITH OPEN FRACTURE EMERGENT SURGERY PLANNED FOR IRRIGATION AND DEBRIDEMENT AND OPEN STABILIZATION, ORIF VERSUS DORSAL BRIDGE PLATING   R/B/A DISCUSSED WITH PT IN OFFICE.  PT VOICED UNDERSTANDING OF PLAN CONSENT SIGNED DAY OF SURGERY PT SEEN AND EXAMINED PRIOR  TO OPERATIVE PROCEDURE/DAY OF SURGERY SITE MARKED. QUESTIONS ANSWERED WILL GO HOME FOLLOWING SURGERY   WE ARE PLANNING SURGERY FOR YOUR UPPER EXTREMITY.  THE RISKS AND BENEFITS OF SURGERY INCLUDE BUT NOT LIMITED TO BLEEDING INFECTION, DAMAGE TO NEARBY NERVES ARTERIES TENDONS, FAILURE OF SURGERY TO ACCOMPLISH ITS INTENDED GOALS, PERSISTENT SYMPTOMS AND NEED FOR FURTHER SURGICAL INTERVENTION. WITH THIS IN MIND WE WILL PROCEED. I HAVE DISCUSSED WITH THE PATIENT THE PRE AND POSTOPERATIVE REGIMEN AND THE DOS AND DON'TS. PT VOICED UNDERSTANDING AND INFORMED CONSENT SIGNED.   Prentice LELON Pagan 02/14/2024, 2:46 PM

## 2024-02-14 NOTE — ED Notes (Signed)
 X ray in room.

## 2024-02-14 NOTE — Anesthesia Postprocedure Evaluation (Signed)
 Anesthesia Post Note  Patient: Dawn Lewis  Procedure(s) Performed: OPEN REDUCTION INTERNAL FIXATION (ORIF) DISTAL RADIUS FRACTURE (Right)     Patient location during evaluation: PACU Anesthesia Type: General Level of consciousness: awake and alert Pain management: pain level controlled Vital Signs Assessment: post-procedure vital signs reviewed and stable Respiratory status: spontaneous breathing, nonlabored ventilation and respiratory function stable Cardiovascular status: blood pressure returned to baseline and stable Postop Assessment: no apparent nausea or vomiting Anesthetic complications: no   No notable events documented.  Last Vitals:  Vitals:   02/14/24 1845 02/14/24 1900  BP: (!) 140/52 (!) 142/41  Pulse: 73 73  Resp: 12 14  Temp: (!) 36.3 C   SpO2: 94% 94%    Last Pain:  Vitals:   02/14/24 1900  PainSc: 0-No pain                 Garnette FORBES Skillern

## 2024-02-14 NOTE — Anesthesia Procedure Notes (Signed)
 Anesthesia Regional Block: Supraclavicular block   Pre-Anesthetic Checklist: , timeout performed,  Correct Patient, Correct Site, Correct Laterality,  Correct Procedure, Correct Position, site marked,  Risks and benefits discussed,  Surgical consent,  Pre-op evaluation,  At surgeon's request and post-op pain management  Laterality: Right  Prep: chloraprep       Needles:  Injection technique: Single-shot  Needle Type: Echogenic Needle     Needle Length: 9cm  Needle Gauge: 21     Additional Needles:   Procedures:,,,, ultrasound used (permanent image in chart),,    Narrative:  Start time: 02/14/2024 4:09 PM End time: 02/14/2024 4:16 PM Injection made incrementally with aspirations every 5 mL.  Performed by: Personally  Anesthesiologist: Corinne Garnette BRAVO, MD  Additional Notes: No pain on injection. No increased resistance to injection. Injection made in 5cc increments.  Good needle visualization.  Patient tolerated procedure well.

## 2024-02-14 NOTE — ED Provider Notes (Signed)
 [place] EMERGENCY DEPARTMENT AT [facility] Provider Note   CSN: [id number] Arrival date & time: 02/14/24  1309     Patient presents with: Wrist Pain   Dawn Lewis is a 68 y.o. female.   PMH hypothyroid, T1DM, Osteoporosis, HLD, GERD, presenting after a fall from her kitchen counter while cleaning her windows.  She reports that she was sitting in the sink when she fell backwards landing on her right arm outstretched behind her.  She did not hit her head but did also land on her right hip.  She denies any loss of consciousness.   Wrist Pain       Prior to Admission medications   Medication Sig Start Date End Date Taking? Authorizing Provider  Alpha Lipoic Acid 200 MG CAPS Take 1 capsule by mouth 2 (two) times daily.    [provider]  CALCIUM CITRATE PO Take by mouth. Patient not taking: Reported on 01/05/2024    [provider]  cetirizine (ZYRTEC) 10 MG tablet Take 10 mg by mouth daily.    [provider]  Cholecalciferol (VITAMIN D3 PO) Take by mouth.    [provider]  Coenzyme Q10 (CO Q 10) 100 MG CAPS Take 1 capsule by mouth daily at 6 (six) AM.    [provider]  COLLAGEN PO Take 6,000 mg by mouth daily at 6 (six) AM.    [provider]  diclofenac sodium (VOLTAREN) 1 % GEL Apply topically. TAKING AS NEEDED    [provider]  estradiol (ESTRACE) 0.1 MG/GM vaginal cream Place 1 Applicatorful vaginally at bedtime.    [provider]  Evening Primrose Oil 500 MG CAPS Take 2 capsules by mouth daily.    [provider]  gabapentin (NEURONTIN) 100 MG capsule Take 1 capsule by mouth at bedtime. 05/01/22   [provider]  glucosamine-chondroitin 500-400 MG tablet Take 3 tablets by mouth daily.     [provider]  hydroxychloroquine (PLAQUENIL) 200 MG tablet Take 200 mg by mouth 2 (two) times daily.    [provider]  levothyroxine (SYNTHROID) 25 MCG  tablet Take 25 mcg by mouth daily before breakfast.    [provider]  linaclotide  (LINZESS ) 72 MCG capsule Take 1 capsule (72 mcg total) by mouth daily before breakfast. 09/24/23   Lemmon, Delon Gibson, PA  lisinopril (PRINIVIL,ZESTRIL) 20 MG tablet Take 10 mg by mouth daily.    [provider]  Magnesium 400 MG TABS Take 400 mg by mouth daily.     [provider]  metroNIDAZOLE (METROGEL) 1 % gel Apply topically daily.    [provider]  Misc Natural Products (TART CHERRY ADVANCED) CAPS Take 4 capsules by mouth daily.    [provider]  Multiple Vitamin (MULTIVITAMIN) tablet Take 1 tablet by mouth 2 (two) times daily.    [provider]  NOVOLOG 100 UNIT/ML injection Inject 100 Units into the skin daily. Insulin  pump range 26units-30 units 11/11/13   [provider]  Omega-3 Fatty Acids (OMEGA 3 PO) Take 2,000 mg by mouth daily.     [provider]  ONE TOUCH ULTRA TEST test strip 1 each by Other route daily. 11/11/13   [provider]  OVER THE COUNTER MEDICATION     [provider]  OVER THE COUNTER MEDICATION     [provider]  OVER THE COUNTER MEDICATION     [provider]  PRESCRIPTION MEDICATION Novolog insulin  via  PUMP for DM (28U daily on average)    [provider]  rosuvastatin (CRESTOR) 20 MG tablet Take 1 tablet by mouth daily.    [provider]  sertraline (ZOLOFT) 50 MG tablet Take 100 mg by mouth daily.    [provider]  terconazole (TERAZOL 7) 0.4 % vaginal cream Place 1 applicator vaginally as needed. Daily at bedtime as needed    [provider]  Turmeric (QC TUMERIC COMPLEX) 500 MG CAPS Take by mouth.    [provider]    Allergies: Aspirin, Nsaids, and Statins    Review of Systems  Updated Vital Signs BP 118/66 (BP Location: Left Arm)   Pulse (!) 58   Resp (!) 22   Ht 5' 10 (1.778 m)   Wt 78.9 kg   SpO2  100%   BMI 24.97 kg/m   Physical Exam Constitutional:      General: She is in acute distress.  Eyes:     Extraocular Movements: Extraocular movements intact.     Pupils: Pupils are equal, round, and reactive to light.  Cardiovascular:     Rate and Rhythm: Normal rate and regular rhythm.  Pulmonary:     Effort: Pulmonary effort is normal.     Breath sounds: Normal breath sounds.  Musculoskeletal:     Right wrist: Deformity, effusion and bony tenderness present. Decreased range of motion.     Left wrist: Normal.     Right hand: Decreased range of motion.  Neurological:     Mental Status: She is alert and oriented to person, place, and time.     (all labs ordered are listed, but only abnormal results are displayed) Labs Reviewed  CBC WITH DIFFERENTIAL/PLATELET - Abnormal; Notable for the following components:      Result Value   RBC 5.31 (*)    Hemoglobin 15.3 (*)    All other components within normal limits  COMPREHENSIVE METABOLIC PANEL WITH GFR    EKG: None  Radiology: No results found.   Procedures   Medications Ordered in the ED  ceFAZolin (ANCEF) IVPB 2g/100 mL premix (has no administration in time range)  Tdap (ADACEL) injection 0.5 mL (0.5 mLs Intramuscular Given 02/14/24 1338)  HYDROmorphone (DILAUDID) injection 1 mg (1 mg Intravenous Given 02/14/24 1337)                                    Medical Decision Making This is a patient presenting after a FOOSH injury with likely compound fracture which is also open.  Will proceed with pain control via 1 g Dilaudid, and obtain x-rays of the wrist and hip on the right.  Will also give empiric prophylaxis with 2 g Ancef, and administered Tdap.  Orthopedic surgery has been consulted, will follow-up once they have made their recommendations.  Splint has been ordered for comfort and stabilization pending their evaluation.  After discussion with ortho surgeon, they will discharge from here with splint and proceed to  [facility] for surgical intervention.   Amount and/or Complexity of Data Reviewed Labs: ordered. Radiology: ordered.  Risk Prescription drug management.       Final diagnoses:  None    ED Discharge Orders     None          Cleotilde Lukes, DO 02/14/24 1433    Dasie Faden, MD 02/15/24 1020

## 2024-02-14 NOTE — Progress Notes (Signed)
 Geni BIRCH., RN and Delon HERO., RN relayed to me that the patient was having nausea and they had just given IV barhemsys 10 minutes prior. They said that Dr. Corinne wanted the patient to stay in PACU until she felt ready to go home, they said there was no amount of time she needed to stay in PACU, just until the patient said she felt ready. They told me the patient was in Phase 2 and discharge instructions and documentation were all done and asked me to watch the patient until she was ready to be discharged home. Patient was hooked up to pulse oximeter only (pulse in 70s and oxygen around 95% on room air, IV still in place and she was resting in a recliner eating ice chips. I gave patient alcohol swabs to help with the nausea and told her I would check in on her soon. About 15-20 minutes later I checked on the patient and she said she felt better and was ready to go home. I unhooked patient and she got into the wheelchair with standby assist. I checked with the patient again to ensure she was still feeling well and she reported she was still well and ready to go home. I removed the IV and took her out to her husband who was waiting in the car. I documented the IV removal and the Out of Phase 2 and Procedural Care Complete time because those were the ones I was involved in. Patient was grateful for care received in PACU

## 2024-02-14 NOTE — ED Triage Notes (Addendum)
 Pt reports she was at home in the kitchen standing on the countertop and fell backwards. Hit right side of back and right wrist. Wrist appears deformed and is bleeding. 10/10 pain reported in wrist

## 2024-02-14 NOTE — Discharge Instructions (Addendum)
KEEP BANDAGE CLEAN AND DRY CALL OFFICE FOR F/U APPT [id number] in 2 weeks KEEP HAND ELEVATED ABOVE HEART OK TO APPLY ICE TO OPERATIVE AREA CONTACT OFFICE IF ANY WORSENING PAIN OR CONCERNS.  

## 2024-02-14 NOTE — Transfer of Care (Signed)
 Immediate Anesthesia Transfer of Care Note  Patient: Dawn Lewis  Procedure(s) Performed: OPEN REDUCTION INTERNAL FIXATION (ORIF) DISTAL RADIUS FRACTURE (Right)  Patient Location: PACU  Anesthesia Type:General  Level of Consciousness: awake, alert , and oriented  Airway & Oxygen Therapy: Patient Spontanous Breathing  Post-op Assessment: Report given to RN and Post -op Vital signs reviewed and stable  Post vital signs: Reviewed and stable  Last Vitals:  Vitals Value Taken Time  BP 125/56 02/14/24 18:30  Temp 35.8 C 02/14/24 18:16  Pulse 69 02/14/24 18:32  Resp 11 02/14/24 18:32  SpO2 94 % 02/14/24 18:32  Vitals shown include unfiled device data.  Last Pain:  Vitals:   02/14/24 1816  PainSc: 0-No pain         Complications: No notable events documented.

## 2024-02-14 NOTE — ED Provider Notes (Signed)
 I saw and evaluated the patient, reviewed the resident's note and I agree with the findings and plan.   68 year old female presents after mechanical fall just prior to arrival onto her right back and wrist.  No LOC.  Noted some bleeding from the volar aspect of the right wrist.  Has trouble moving her fingers on her right hand.  Also notes right hip pain.  On physical exam, severe deformity noted to right wrist.  Puncture wound appreciated on volar aspect without the bleeding.  Suspect open fracture.  2 g of Ancef ordered.  Will consult orthopedics   Dasie Faden, MD 02/14/24 1336

## 2024-02-14 NOTE — Op Note (Signed)
 PREOPERATIVE DIAGNOSIS: Right wrist distal radius open grade 1 open comminuted intra-articular distal radius fracture of 5 or more fragments Right wrist distal ulnar fracture comminuted  POSTOPERATIVE DIAGNOSIS: Same  ATTENDING SURGEON: Dr. Prentice Pagan who scrubbed and present for the entire procedure  ASSISTANT SURGEON: None  ANESTHESIA: Regional with general anesthetic via LMA.  OPERATIVE PROCEDURE: Debridement of skin subcutaneous tissue and bone associated right wrist open distal radius fracture excisional debridement Open treatment of right wrist intra-articular distal radius fracture 3 more fragments Right wrist brachial radialis tendon release, tendon tenotomy Repair right wrist pronator quadratus Radiographs 3 views right wrist Closed treatment of right wrist distal ulna fracture with manipulation  IMPLANTS: Biomet DVR cross lock, 2 cc Biomet bone graft stay graft  EBL: Minimal  RADIOGRAPHIC INTERPRETATION: AP lateral and oblique views of the wrist do show the volar plate fixation in place with good alignment of the distal radius and ulna  SURGICAL INDICATIONS: Patient is a right-hand-dominant female insulin  dependent diabetic who sustained a fall on an outstretched wrist.  Patient sustained the open highly comminuted distal radius fracture.  Patient was seen and evaluated recommend undergo the above procedure.  Risks of surgery include but not limited to bleeding infection damage nearby nerves arteries or tendons loss of motion of the wrist and digits incomplete relief of symptoms and need for further surgical invention.  Signed informed consent was obtained on the day of surgery.  SURGICAL TECHNIQUE: The patient was prepped identified in the preoperative holding area marked the prior marker made on the right wrist indicate the correct operative site.  The patient then brought back the operating placed supine on the anesthesia table where the regional anesthetic was administered.   Patient tolerates well.  A well-padded tourniquet placed on the right brachium unsealed the appropriate drape.  The right upper extremities then prepped and draped normal sterile fashion.  Timeout was called the correct site identified procedure then began.  Preoperative antibiotics were given prior to skin incision.  The limb was elevated tourniquet insufflated.  Curvilinear incision incorporating the open fracture site was then carried out along the course of the FCR.  Dissection carried out through the skin subcutaneous tissue.  The FCR sheath was opened proximally distally.  Going through the floor the FCR sheath will remain to the pronator quadratus was then elevated.  The brachial radialis was then carefully released.  Protection of the first dorsal compartment tendons were then carried out separate intervention was done to release the brachial radialis and tendon tenotomy was carried out to obtain articular congruity on the radial column.  The patient did have the highly comminuted intra-articular fracture of 5 more fragments.  Several metaphyseal fragments that did not have any soft tissue tact splints were removed.  Open reduction was then performed.  The volar plate was then applied and held proximally distally with K wires.  Once it alignment was maintained the plate height was then carefully adjusted.  The bone graft substitute was then packed into the metaphyseal defect.  Following this the intra-articular fracture 3 more fragments was then carefully manipulated placed in good position and distal fixation was then carried out from the ulnar column to the radial column with a combination of distal locking pegs and screws.  Final shaft fixation was then carried out with a combination of locking nonlocking screws.  The wound was thoroughly irrigated.  Excisional debridement of skin subcutaneous tissue and bone was then carried out of the open fracture fragments and the  devascularized fracture fragments as  well as the skin and subcutaneous tissue.  This time a sharp scissors and knife.  The wound was then thoroughly irrigated once again.  In order to obtain soft tissue coverage over the metaphyseal defect the pronator quadratus was then repaired.  This was a separate intervention and repair of the pronator quadratus was completed.  This was done with 2-0 Vicryl suture.  The subcutaneous tissues were then closed with 4-0 Monocryl.  The skin closed with simple 3-0 Prolene sutures.  Xeroform dressing sterile compressive bandage was applied.  The patient tolerated the procedure well.  Mini C arm images were then used to confirm alignment of the distal radius.  The patient had a highly comminuted distal ulna fracture which was relatively in good position.  Given the multiple fragments and the bone quality decision was made to treat this in a closed fashion with manipulation.  Sterile compressive bandage and been applied and the patient were then placed in a well-padded sugar-tong splint.  The patient was then taken recovery room extubated in good condition.  POSTOPERATIVE PLAN: Patient be discharged to home.  See her back in the office in 13 days for wound check suture removal x-rays application of a long-arm cast for total of 4 weeks 4 weeks long-arm immobilization in a short cast for 2 weeks.  Radiographs at each visit we will place the therapy order at the 4-week mark.  Go very slow given the highly comminuted distal radial ulnar joint fractures.

## 2024-02-16 ENCOUNTER — Encounter (HOSPITAL_COMMUNITY): Payer: Self-pay | Admitting: Orthopedic Surgery
